# Patient Record
Sex: Male | Born: 1985 | Race: White | Hispanic: No | Marital: Single | Smoking: Never smoker
Health system: Southern US, Community
[De-identification: ages and names within clinical notes are randomized; demographics above are authoritative.]

## PROBLEM LIST (undated history)

## (undated) DIAGNOSIS — N200 Calculus of kidney: Secondary | ICD-10-CM

## (undated) DIAGNOSIS — J45909 Unspecified asthma, uncomplicated: Secondary | ICD-10-CM

## (undated) HISTORY — PX: HERNIA REPAIR: SHX51

## (undated) HISTORY — DX: Unspecified asthma, uncomplicated: J45.909

## (undated) HISTORY — DX: Calculus of kidney: N20.0

---

## 2013-03-19 ENCOUNTER — Ambulatory Visit: Payer: Self-pay | Admitting: Surgery

## 2013-03-19 LAB — CBC WITH DIFFERENTIAL/PLATELET
Basophil #: 0 10*3/uL (ref 0.0–0.1)
Basophil %: 0.6 %
Eosinophil #: 0.1 10*3/uL (ref 0.0–0.7)
Eosinophil %: 2 %
HCT: 47 % (ref 40.0–52.0)
HGB: 16.2 g/dL (ref 13.0–18.0)
Lymphocyte %: 31.6 %
MCHC: 34.3 g/dL (ref 32.0–36.0)
Monocyte #: 0.6 x10 3/mm (ref 0.2–1.0)
Monocyte %: 11.8 %
Platelet: 171 10*3/uL (ref 150–440)

## 2013-03-19 LAB — BASIC METABOLIC PANEL
Anion Gap: 1 — ABNORMAL LOW (ref 7–16)
Chloride: 107 mmol/L (ref 98–107)
Creatinine: 0.92 mg/dL (ref 0.60–1.30)
EGFR (African American): >60
EGFR (Non-African Amer.): >60
Osmolality: 276 (ref 275–301)
Potassium: 4.3 mmol/L (ref 3.5–5.1)

## 2013-03-25 ENCOUNTER — Ambulatory Visit: Payer: Self-pay | Admitting: Surgery

## 2014-10-24 NOTE — Op Note (Signed)
PATIENT NAME:  Gordon Chang, Gordon Chang MR#:  [id number] DATE OF BIRTH:  1985-09-01  DATE OF PROCEDURE:  03/25/2013  PREOPERATIVE DIAGNOSIS: Right inguinal hernia.   POSTOPERATIVE DIAGNOSIS: Large indirect right inguinal hernia.   PROCEDURE PERFORMED: Laparoscopic right inguinal hernia repair, total TEP repair.   ESTIMATED BLOOD LOSS: 10 mL.   COMPLICATIONS: None.   SPECIMEN: None.   ANESTHESIA: General.  INDICATION FOR SURGERY: Mr. Gordon Chang is a pleasant 29 year old male, who presented to my office with a large symptomatic right inguinal hernia. We thus brought him to the operating room suite for right inguinal hernia repair with mesh.   DETAILS OF PROCEDURE: As follows: Informed consent was obtained. Mr. Gordon Chang was brought to the operating room suite. He was laid supine on the operating room table. He was induced. Endotracheal tube was placed and general anesthesia was administered. His abdomen and right groin were then prepped and draped in standard surgical fashion. A timeout was then performed correctly identifying patient name, operative site and procedure to be performed. An infraumbilical incision was made on the right side. His rectus sheath was cleaned off. It was incised with an 11 blade knife. The rectus was retracted laterally and an S retractor was used to obtain the retrorectus space anterior to the posterior sheath. The balloon trocar was then placed underneath the right rectus muscle into the preperitoneal space. The balloon was insufflated and then removed. A trocar was then placed into the same space and inflated. A 30 degree 10 mm scope was then placed and the preperitoneal space was insufflated. There was a large indirect hernia. Cooper's was cleared off. The hernia sac was reduced off the colon and the preperitoneal space was dissected further laterally. A 4 x 6 mm piece of Atrium mesh was then placed into the preperitoneal space. It was cut lengthwise to make a ring around the cord. The  fascia was then flattened out. It was secured to the anterior abdominal wall as well as to Cooper's ligament and laterally. I then placed the cord through this piece of mesh and then re-created the external ring. The external ring was re-created with tacks. After I was happy with mesh placement, the preperitoneal space was desufflated with insurance that the mesh did not roll up on itself. The anterior rectus sheath fasciotomy was then closed with a figure-of-eight 0 Vicryl. All ports were then closed with interrupted 4-0 Monocryl deep dermal sutures. Dermabond was then placed over the wounds. The patient was then awoken, extubated and brought to the postanesthesia care unit. There were no immediate complications. Needle, sponge, and instrument counts were correct at the end of the procedure.   ____________________________ Si Raiderhristopher A. Chanise Habeck, MD cal:aw D: 03/25/2013 09:49:31 ET T: 03/25/2013 10:07:08 ET JOB#: [id number]  cc: Cristal Deerhristopher A. Ineta Sinning, MD, <Dictator> Jarvis NewcomerHRISTOPHER A Ousman Dise MD ELECTRONICALLY SIGNED 04/01/2013 11:31

## 2015-11-26 ENCOUNTER — Ambulatory Visit
Admission: RE | Admit: 2015-11-26 | Discharge: 2015-11-26 | Disposition: A | Discharge status: Home / Self Care | Payer: 59 | Source: Ambulatory Visit | Attending: Emergency Medicine | Admitting: Emergency Medicine

## 2015-11-26 ENCOUNTER — Other Ambulatory Visit (HOSPITAL_COMMUNITY): Payer: Self-pay | Admitting: Emergency Medicine

## 2015-11-26 ENCOUNTER — Other Ambulatory Visit: Payer: Self-pay | Admitting: Emergency Medicine

## 2015-11-26 DIAGNOSIS — R938 Abnormal findings on diagnostic imaging of other specified body structures: Secondary | ICD-10-CM | POA: Insufficient documentation

## 2015-11-26 DIAGNOSIS — N50811 Right testicular pain: Secondary | ICD-10-CM

## 2016-08-25 ENCOUNTER — Encounter: Payer: Self-pay | Admitting: Emergency Medicine

## 2016-08-25 ENCOUNTER — Emergency Department
Admission: EM | Admit: 2016-08-25 | Discharge: 2016-08-25 | Disposition: A | Discharge status: Home / Self Care | Payer: 59 | Attending: Emergency Medicine | Admitting: Emergency Medicine

## 2016-08-25 ENCOUNTER — Emergency Department: Payer: 59

## 2016-08-25 DIAGNOSIS — N23 Unspecified renal colic: Secondary | ICD-10-CM

## 2016-08-25 DIAGNOSIS — R109 Unspecified abdominal pain: Secondary | ICD-10-CM | POA: Diagnosis present

## 2016-08-25 LAB — BASIC METABOLIC PANEL
Anion gap: 10 (ref 5–15)
BUN: 19 mg/dL (ref 6–20)
CALCIUM: 9.6 mg/dL (ref 8.9–10.3)
CHLORIDE: 104 mmol/L (ref 101–111)
CO2: 25 mmol/L (ref 22–32)
CREATININE: 1.14 mg/dL (ref 0.61–1.24)
GFR calc non Af Amer: >60 mL/min (ref >60)
Glucose, Bld: 120 mg/dL — ABNORMAL HIGH (ref 65–99)
Potassium: 3.9 mmol/L (ref 3.5–5.1)
SODIUM: 139 mmol/L (ref 135–145)

## 2016-08-25 LAB — URINALYSIS, COMPLETE (UACMP) WITH MICROSCOPIC
BACTERIA UA: NONE SEEN
Bilirubin Urine: NEGATIVE
GLUCOSE, UA: NEGATIVE mg/dL
KETONES UR: 80 mg/dL — AB
Leukocytes, UA: NEGATIVE
Nitrite: NEGATIVE
PROTEIN: 30 mg/dL — AB
Specific Gravity, Urine: 1.029 (ref 1.005–1.030)
pH: 6 (ref 5.0–8.0)

## 2016-08-25 LAB — CBC
HCT: 46.3 % (ref 40.0–52.0)
HEMOGLOBIN: 15.9 g/dL (ref 13.0–18.0)
MCH: 29.4 pg (ref 26.0–34.0)
MCHC: 34.3 g/dL (ref 32.0–36.0)
MCV: 85.6 fL (ref 80.0–100.0)
Platelets: 217 10*3/uL (ref 150–440)
RBC: 5.41 MIL/uL (ref 4.40–5.90)
RDW: 12.6 % (ref 11.5–14.5)
WBC: 10.5 10*3/uL (ref 3.8–10.6)

## 2016-08-25 MED ORDER — DOCUSATE SODIUM 100 MG PO CAPS: ORAL_CAPSULE | ORAL | 0 refills | Status: DC

## 2016-08-25 MED ORDER — TAMSULOSIN HCL 0.4 MG PO CAPS: ORAL_CAPSULE | ORAL | 0 refills | Status: DC

## 2016-08-25 MED ORDER — HYDROCODONE-ACETAMINOPHEN 5-325 MG PO TABS: 1.0000 | ORAL_TABLET | ORAL | 0 refills | Status: DC | PRN

## 2016-08-25 MED ORDER — ONDANSETRON 4 MG PO TBDP: ORAL_TABLET | ORAL | 0 refills | Status: DC

## 2016-08-25 MED ORDER — HYDROCODONE-ACETAMINOPHEN 5-325 MG PO TABS
2.0000 | ORAL_TABLET | Freq: Once | ORAL | Status: AC
  Administered 2016-08-25: 1 via ORAL
  Filled 2016-08-25: qty 2

## 2016-08-25 NOTE — ED Notes (Signed)
MD at the bedside for pt evaluation  

## 2016-08-25 NOTE — ED Triage Notes (Addendum)
Patient ambulatory to triage with steady gait, without difficulty or distress noted; pt reports urinary frequency and right flank pain; denies hx of same; denies any other accomp symptoms; denies pain to abd but st some "pressure" to suprapubic region with urination

## 2016-08-25 NOTE — ED Provider Notes (Signed)
[facility] Emergency Department Provider Note  ____________________________________________   First MD Initiated Contact with Patient 08/25/16 [phone]     (approximate)  I have reviewed the triage vital signs and the nursing notes.   HISTORY  Chief Complaint Flank Pain    HPI Gordon Chang is a 31 y.o. male with no significant PMH who presents for evaluation of acute onset right flank pain radiating to the abdomen.  Started yesterday, went away, then came back tonight.  One episode of emesis and some persistent nausea.  Increased urinary  frequency, no dysuria, no hematuria.  Movement/ambulation makes the pain worse, rest and supine position makes it better.  Denies fever/chills, CP, SOB.   History reviewed. No pertinent past medical history.  There are no active problems to display for this patient.   Past Surgical History:  Procedure Laterality Date  . HERNIA REPAIR      Prior to Admission medications   Medication Sig Start Date End Date Taking? Authorizing Provider  docusate sodium (COLACE) 100 MG capsule Take 1 tablet once or twice daily as needed for constipation while taking narcotic pain medicine 08/25/16   Loleta Roseory Pragya Lofaso, MD  HYDROcodone-acetaminophen (NORCO/VICODIN) 5-325 MG tablet Take 1-2 tablets by mouth every 4 (four) hours as needed for moderate pain. 08/25/16   Loleta Roseory Mary-Ann Pennella, MD  ondansetron (ZOFRAN ODT) 4 MG disintegrating tablet Allow 1-2 tablets to dissolve in your mouth every 8 hours as needed for nausea/vomiting 08/25/16   Loleta Roseory Armstrong Creasy, MD  tamsulosin [facility](FLOMAX) 0.4 MG CAPS capsule Take 1 tablet by mouth daily until you pass the kidney stone or no longer have symptoms 08/25/16   Loleta Roseory Agam Tuohy, MD    Allergies Patient has no known allergies.  No family history on file.  Social History Social History  Substance Use Topics  . Smoking status: Never Smoker  . Smokeless tobacco: Never Used  . Alcohol use No    Review of  Systems Constitutional: No fever/chills Eyes: No visual changes. ENT: No sore throat. Cardiovascular: Denies chest pain. Respiratory: Denies shortness of breath. Gastrointestinal: Right flank pain radiating to right side of abdomen.  Emesis x 1, no diarrhea. Genitourinary: Negative for dysuria.  Increased urinary frequency. Musculoskeletal: Negative for back pain. Skin: Negative for rash. Neurological: Negative for headaches, focal weakness or numbness.  10-point ROS otherwise negative.  ____________________________________________   PHYSICAL EXAM:  VITAL SIGNS: ED Triage Vitals  Enc Vitals Group     BP 08/25/16 0216 (!) 161/90     Pulse Rate 08/25/16 0216 64     Resp 08/25/16 0216 18     Temp 08/25/16 0216 97.7 F (36.5 C)     Temp Source 08/25/16 0216 Oral     SpO2 08/25/16 0216 100 %     Weight 08/25/16 0215 125 lb (56.7 kg)     Height 08/25/16 0215 5\' 7"  (1.702 m)     Head Circumference --      Peak Flow --      Pain Score 08/25/16 0214 8     Pain Loc --      Pain Edu? --      Excl. in GC? --     Constitutional: Alert and oriented. Well appearing and in no acute distress. Eyes: Conjunctivae are normal. PERRL. EOMI. Head: Atraumatic. Nose: No congestion/rhinnorhea. Mouth/Throat: Mucous membranes are moist. Neck: No stridor.  No meningeal signs.   Cardiovascular: Normal rate, regular rhythm. Good peripheral circulation. Grossly normal heart sounds. Respiratory: Normal respiratory effort.  No retractions. Lungs CTAB. Gastrointestinal: Soft and nontender. No distention.  Mild right-sided CVA tenderness. Musculoskeletal: No lower extremity tenderness nor edema. No gross deformities of extremities. Neurologic:  Normal speech and language. No gross focal neurologic deficits are appreciated.  Skin:  Skin is warm, dry and intact. No rash noted. Psychiatric: Mood and affect are normal. Speech and behavior are normal.  ____________________________________________    LABS (all labs ordered are listed, but only abnormal results are displayed)  Labs Reviewed  URINALYSIS, COMPLETE (UACMP) WITH MICROSCOPIC - Abnormal; Notable for the following:       Result Value   Color, Urine YELLOW (*)    APPearance CLEAR (*)    Hgb urine dipstick LARGE (*)    Ketones, ur 80 (*)    Protein, ur 30 (*)    Squamous Epithelial / LPF 0-5 (*)    All other components within normal limits  BASIC METABOLIC PANEL - Abnormal; Notable for the following:    Glucose, Bld 120 (*)    All other components within normal limits  CBC   ____________________________________________  EKG  None - EKG not ordered by ED physician ____________________________________________  RADIOLOGY   Ct Renal Stone Study  Result Date: 08/25/2016 CLINICAL DATA:  Urinary frequency and right flank pain EXAM: CT ABDOMEN AND PELVIS WITHOUT CONTRAST TECHNIQUE: Multidetector CT imaging of the abdomen and pelvis was performed following the standard protocol without IV contrast. COMPARISON:  None. FINDINGS: Lower chest: No acute abnormality. Hepatobiliary: Calcified gallstone. No focal hepatic abnormality. No biliary dilatation. Pancreas: Unremarkable. No pancreatic ductal dilatation or surrounding inflammatory changes. Spleen: Normal in size without focal abnormality. Adrenals/Urinary Tract: Adrenal glands within normal limits. Multiple nonobstructing stones in the left kidney, the largest is seen in the midpole and measures 4 mm in size. Punctate stone in the upper pole right kidney. Mild right hydronephrosis and hydroureter, secondary to a 3 mm stone in the mid right ureter. The bladder is unremarkable. Stomach/Bowel: Stomach is within normal limits. Appendix appears normal. No evidence of bowel wall thickening, distention, or inflammatory changes. Vascular/Lymphatic: No significant vascular findings are present. No enlarged abdominal or pelvic lymph nodes. Reproductive: Prostate is unremarkable. Other:  Evidence of prior lower abdominal and right inguinal hernial repair. No free air or free fluid. Musculoskeletal: No acute or significant osseous findings. IMPRESSION: Mild right hydronephrosis and hydroureter secondary to a 3 mm stone in the mid right ureter. There are multiple intrarenal calculi bilaterally. Calcified gallstone. Electronically Signed   By: Jasmine Pang M.D.   On: 08/25/2016 03:36    ____________________________________________   PROCEDURES  Procedure(s) performed:   Procedures   Critical Care performed: No ____________________________________________   INITIAL IMPRESSION / ASSESSMENT AND PLAN / ED COURSE  Pertinent labs & imaging results that were available during my care of the patient were reviewed by me and considered in my medical decision making (see chart for details).  uncomplicated 3-mm right sided ureteral stone.  Sending urine culture given the presence of leukocytosis, but suspect that is due to the blood; no evidence of infection and no systemic symptoms.  Had my usual and customary discussion about kidney stones,   I gave my usual and customary return precautions.   Patient's pain is well controlled at this time.   Clinical Course as of Aug 26 403  Thu Aug 25, 2016  0357 I reviewed the patient's prescription history over the last 12 months in the [place] Controlled Substances Database, and the patient has filled no controlled substances  during that time.   [CF]    Clinical Course User Index [CF] Loleta Rose, MD    ____________________________________________  FINAL CLINICAL IMPRESSION(S) / ED DIAGNOSES  Final diagnoses:  Ureteral colic     MEDICATIONS GIVEN DURING THIS VISIT:  Medications  HYDROcodone-acetaminophen (NORCO/VICODIN) 5-325 MG per tablet 2 tablet (not administered)     NEW OUTPATIENT MEDICATIONS STARTED DURING THIS VISIT:  New Prescriptions   DOCUSATE SODIUM (COLACE) 100 MG CAPSULE    Take 1 tablet once or twice daily as  needed for constipation while taking narcotic pain medicine   HYDROCODONE-ACETAMINOPHEN (NORCO/VICODIN) 5-325 MG TABLET    Take 1-2 tablets by mouth every 4 (four) hours as needed for moderate pain.   ONDANSETRON (ZOFRAN ODT) 4 MG DISINTEGRATING TABLET    Allow 1-2 tablets to dissolve in your mouth every 8 hours as needed for nausea/vomiting   TAMSULOSIN (FLOMAX) 0.4 MG CAPS CAPSULE    Take 1 tablet by mouth daily until you pass the kidney stone or no longer have symptoms    Modified Medications   No medications on file    Discontinued Medications   No medications on file     Note:  This document was prepared using Dragon voice recognition software and may include unintentional dictation errors.    Loleta Rose, MD 08/25/16 [phone]

## 2016-08-25 NOTE — Discharge Instructions (Addendum)
You have been seen in the Emergency Department (ED) today for pain that we believe based on your workup, is caused by kidney stones.  As we have discussed, please drink plenty of fluids.  Please make a follow up appointment with the physician(s) listed elsewhere in this documentation. ° °You may take pain medication as needed but ONLY as prescribed.  Please also take your prescribed Flomax daily.  We also recommend that you take over-the-counter ibuprofen regularly according to label instructions over the next 5 days.  Take it with meals to minimize stomach discomfort. ° °Please see your doctor as soon as possible as stones may take 1-3 weeks to pass and you may require additional care or medications. ° °Do not drink alcohol, drive or participate in any other potentially dangerous activities while taking opiate pain medication as it may make you sleepy. Do not take this medication with any other sedating medications, either prescription or over-the-counter. If you were prescribed Percocet or Vicodin, do not take these with acetaminophen (Tylenol) as it is already contained within these medications. °  °Take Norco as needed for severe pain.  This medication is an opiate (or narcotic) pain medication and can be habit forming.  Use it as little as possible to achieve adequate pain control.  Do not use or use it with extreme caution if you have a history of opiate abuse or dependence.  If you are on a pain contract with your primary care doctor or a pain specialist, be sure to let them know you were prescribed this medication today from the [facility].  This medication is intended for your use only - do not give any to anyone else and keep it in a secure place where nobody else, especially children, have access to it.  It will also cause or worsen constipation, so you may want to consider taking an over-the-counter stool softener while you are taking this medication. ° °Return to the  Emergency Department (ED) or call your doctor if you have any worsening pain, fever, painful urination, are unable to urinate, or develop other symptoms that concern you. ° °

## 2017-01-15 NOTE — Progress Notes (Signed)
01/16/2017 8:59 AM   Gordon Chang 05/14/1986 [id number]  Referring provider: Jethro Bastosrawford, Anne W, NP [address] [place], [place] [address]  Chief Complaint  Patient presents with  . New Patient (Initial Visit)    testicular pain [facility] referral    HPI: Patient is a 31 year old Caucasian male who is referred by Jethro BastosAnne W Crawford, NP for right testicular pain and dysuria.  Reviewing referral records, patient first experienced right testicular pain in May 2017. A scrotal ultrasound was performed at that time which noted nonspecific predominantly hypoechoic collection along the inferior and lateral margins of the right testicle, most prominent inferiorly, which is nonspecific in appearance but is likely indicative of an acute infectious or inflammatory process. As the epididymal tail is not seen separate from this collection, I suspect this is sequela of an acute epididymitis. Given its nonspecific appearance, recommend follow-up scrotal ultrasound at some point to ensure resolution.  Both testicles appear normal. No evidence of testicular torsion or orchitis.  His urine culture, GC and chlamydia cultures and trichomonas were negative at that time. He was given NSAIDs and an antibiotic.  In February 2018, he was seen in the emergency department for right-sided flank pain and was found to have a small right UVJ stone.  CT Renal stone study noted mild right hydronephrosis and hydroureter secondary to a 3 mm stone in the mid right ureter. There are multiple intrarenal calculi bilaterally.  Calcified gallstone.  More recently, he was seen back at [facility] for right testicular pain and dysuria. Again his cultures were negative. He was given an antibiotic, Pyridium and NSAIDs. His symptoms are not resolved and he was referred to urology.  Today, he is experiencing frequency and dysuria.  He states the pain with urination started last Sunday associated with pain in the left  groin.  He also had an episode of left flank pain with bending.  He has not had gross hematuria.  He has not had fevers, chills, nausea or vomiting.  His UA today was unremarkable.  His PVR was 0 mL.  He states that his pain is at a level of 6/10 when it occurs.  It only lasts for a few minutes.    PMH: Past Medical History:  Diagnosis Date  . Asthma   . Kidney stone     Surgical History: Past Surgical History:  Procedure Laterality Date  . HERNIA REPAIR      Home Medications:  Allergies as of 01/16/2017   No Known Allergies     Medication List       Accurate as of 01/16/17  8:59 AM. Always use your most recent med list.          docusate sodium 100 MG capsule Commonly known as:  COLACE Take 1 tablet once or twice daily as needed for constipation while taking narcotic pain medicine   HYDROcodone-acetaminophen 5-325 MG tablet Commonly known as:  NORCO/VICODIN Take 1-2 tablets by mouth every 4 (four) hours as needed for moderate pain.   ondansetron 4 MG disintegrating tablet Commonly known as:  ZOFRAN ODT Allow 1-2 tablets to dissolve in your mouth every 8 hours as needed for nausea/vomiting   tamsulosin 0.4 MG Caps capsule Commonly known as:  FLOMAX Take 1 tablet by mouth daily until you pass the kidney stone or no longer have symptoms       Allergies: No Known Allergies  Family History: Family History  Problem Relation Age of Onset  .  Prostate cancer Neg Hx   . Kidney cancer Neg Hx   . Bladder Cancer Neg Hx     Social History:  reports that he has never smoked. He has never used smokeless tobacco. He reports that he drinks alcohol. He reports that he does not use drugs.  ROS: UROLOGY Frequent Urination?: Yes Hard to postpone urination?: No Burning/pain with urination?: Yes Get up at night to urinate?: No Leakage of urine?: No Urine stream starts and stops?: No Trouble starting stream?: No Do you have to strain to urinate?: No Blood in urine?:  No Urinary tract infection?: No Sexually transmitted disease?: No Injury to kidneys or bladder?: No Painful intercourse?: No Weak stream?: No Erection problems?: No Penile pain?: No  Gastrointestinal Nausea?: No Vomiting?: No Indigestion/heartburn?: No Diarrhea?: No Constipation?: No  Constitutional Fever: No Night sweats?: No Weight loss?: No Fatigue?: No  Skin Skin rash/lesions?: No Itching?: No  Eyes Blurred vision?: No Double vision?: No  Ears/Nose/Throat Sore throat?: No Sinus problems?: Yes  Hematologic/Lymphatic Swollen glands?: No Easy bruising?: No  Cardiovascular Leg swelling?: No Chest pain?: No  Respiratory Cough?: Yes Shortness of breath?: No  Endocrine Excessive thirst?: No  Musculoskeletal Back pain?: Yes Joint pain?: No  Neurological Headaches?: No Dizziness?: No  Psychologic Depression?: No Anxiety?: No  Physical Exam: BP (!) 161/92   Pulse 68   Ht 5\' 7"  (1.702 m)   Wt 121 lb 11.2 oz (55.2 kg)   BMI 19.06 kg/m   Constitutional: Well nourished. Alert and oriented, No acute distress. HEENT: [place] AT, moist mucus membranes. Trachea midline, no masses. Cardiovascular: No clubbing, cyanosis, or edema. Respiratory: Normal respiratory effort, no increased work of breathing. GI: Abdomen is soft, non tender, non distended, no abdominal masses. Liver and spleen not palpable.  No hernias appreciated.  Stool sample for occult testing is not indicated.   GU: No CVA tenderness.  No bladder fullness or masses.  Patient with circumcised phallus.   Urethral meatus is patent.  No penile discharge. No penile lesions or rashes. Scrotum without lesions, cysts, rashes and/or edema.  Testicles are located scrotally bilaterally. No masses are appreciated in the testicles. Left and right epididymis are normal. Rectal: Deferred.   Skin: No rashes, bruises or suspicious lesions. Lymph: No cervical or inguinal adenopathy. Neurologic: Grossly intact, no  focal deficits, moving all 4 extremities. Psychiatric: Normal mood and affect.  Laboratory Data: Lab Results  Component Value Date   WBC 10.5 08/25/2016   HGB 15.9 08/25/2016   HCT 46.3 08/25/2016   MCV 85.6 08/25/2016   PLT 217 08/25/2016    Lab Results  Component Value Date   CREATININE 1.14 08/25/2016   I have independently reviewed the labs.  Urinalysis Unremarkable.  See EPIC.    Pertinent Imaging: CLINICAL DATA:  Right testicular pain hand inflammation for 2 days.  Right inguinal hernia repair in 2014.  EXAM: SCROTAL ULTRASOUND  DOPPLER ULTRASOUND OF THE TESTICLES  TECHNIQUE: Complete ultrasound examination of the testicles, epididymis, and other scrotal structures was performed. Color and spectral Doppler ultrasound were also utilized to evaluate blood flow to the testicles.  COMPARISON:  None.  FINDINGS: Right testicle  Measurements: 3.9 x 2.2 x 2.9. No mass or microlithiasis visualized. There is, however, a heterogeneous predominantly hypoechoic collection within the soft tissues lateral and inferior to the right testicle. This likely represents an infectious or inflammatory collection but is of uncertain etiology. It is in the expected area of the epididymal tail and, therefore, could represent  sequela of an acute epididymitis. Sonographer does not describe any peristalsing bowel in this area to suggest bowel hernia.  Left testicle  Measurements: 3.9 x 2.3 x 2.9 cm. No mass or microlithiasis visualized.  Right epididymis:  Normal in size and appearance.  Left epididymis:  Normal in size and appearance.  Hydrocele:  Small hydroceles bilaterally.  Varicocele:  None visualized.  Pulsed Doppler interrogation of both testes demonstrates normal low resistance arterial and venous waveforms bilaterally.  IMPRESSION: 1. Nonspecific predominantly hypoechoic collection along the inferior and lateral margins of the right testicle,  most prominent inferiorly, which is nonspecific in appearance but is likely indicative of an acute infectious or inflammatory process. As the epididymal tail is not seen separate from this collection, I suspect this is sequela of an acute epididymitis. Given its nonspecific appearance, recommend follow-up scrotal ultrasound at some point to ensure resolution. 2. Both testicles appear normal. No evidence of testicular torsion or orchitis.   Electronically Signed   By: Bary Richard M.D.   On: 11/26/2015 19:44  CLINICAL DATA:  Urinary frequency and right flank pain  EXAM: CT ABDOMEN AND PELVIS WITHOUT CONTRAST  TECHNIQUE: Multidetector CT imaging of the abdomen and pelvis was performed following the standard protocol without IV contrast.  COMPARISON:  None.  FINDINGS: Lower chest: No acute abnormality.  Hepatobiliary: Calcified gallstone. No focal hepatic abnormality. No biliary dilatation.  Pancreas: Unremarkable. No pancreatic ductal dilatation or surrounding inflammatory changes.  Spleen: Normal in size without focal abnormality.  Adrenals/Urinary Tract: Adrenal glands within normal limits. Multiple nonobstructing stones in the left kidney, the largest is seen in the midpole and measures 4 mm in size. Punctate stone in the upper pole right kidney. Mild right hydronephrosis and hydroureter, secondary to a 3 mm stone in the mid right ureter. The bladder is unremarkable.  Stomach/Bowel: Stomach is within normal limits. Appendix appears normal. No evidence of bowel wall thickening, distention, or inflammatory changes.  Vascular/Lymphatic: No significant vascular findings are present. No enlarged abdominal or pelvic lymph nodes.  Reproductive: Prostate is unremarkable.  Other: Evidence of prior lower abdominal and right inguinal hernial repair. No free air or free fluid.  Musculoskeletal: No acute or significant osseous  findings.  IMPRESSION: Mild right hydronephrosis and hydroureter secondary to a 3 mm stone in the mid right ureter. There are multiple intrarenal calculi bilaterally.  Calcified gallstone.   Electronically Signed   By: Jasmine Pang M.D.   On: 08/25/2016 03:36 I have independently reviewed the films.    Assessment & Plan:   1. Left flank pain  - history of stones  - will obtain CT Renal stone study for further evaluation  - Advised to contact our office or seek treatment in the ED if becomes febrile or pain/ vomiting are difficult control in order to arrange for emergent/urgent intervention  2. Left groin pain  - may be due to an ureteral stone   - CT Renal stone study pending  3. History of nephrolithiasis  - CT Renal stone study pending  4. Right epididymal mass/inflammation  - repeat scrotal ultrasound to ensure resolution   Return for for scrotal ultrasound report and CT report.  These notes generated with voice recognition software. I apologize for typographical errors.  Michiel Cowboy, PA-C  [facility] [address], [address] [place], [place] [address] [phone]

## 2017-01-16 ENCOUNTER — Encounter: Payer: Self-pay | Admitting: Urology

## 2017-01-16 ENCOUNTER — Ambulatory Visit (INDEPENDENT_AMBULATORY_CARE_PROVIDER_SITE_OTHER): Payer: 59 | Admitting: Urology

## 2017-01-16 ENCOUNTER — Telehealth: Payer: Self-pay | Admitting: Urology

## 2017-01-16 VITALS — BP 161/92 | HR 68 | Ht 67.0 in | Wt 121.7 lb

## 2017-01-16 DIAGNOSIS — N50819 Testicular pain, unspecified: Secondary | ICD-10-CM | POA: Diagnosis not present

## 2017-01-16 DIAGNOSIS — R109 Unspecified abdominal pain: Secondary | ICD-10-CM | POA: Diagnosis not present

## 2017-01-16 DIAGNOSIS — Z87442 Personal history of urinary calculi: Secondary | ICD-10-CM | POA: Diagnosis not present

## 2017-01-16 DIAGNOSIS — N509 Disorder of male genital organs, unspecified: Secondary | ICD-10-CM | POA: Diagnosis not present

## 2017-01-16 DIAGNOSIS — N5089 Other specified disorders of the male genital organs: Secondary | ICD-10-CM

## 2017-01-16 LAB — URINALYSIS, COMPLETE
Bilirubin, UA: NEGATIVE
Glucose, UA: NEGATIVE
Ketones, UA: NEGATIVE
Leukocytes, UA: NEGATIVE
NITRITE UA: NEGATIVE
PH UA: 6 (ref 5.0–7.5)
Protein, UA: NEGATIVE
RBC, UA: NEGATIVE
Specific Gravity, UA: >1.03 — ABNORMAL HIGH (ref 1.005–1.030)
Urobilinogen, Ur: 2 mg/dL — ABNORMAL HIGH (ref 0.2–1.0)

## 2017-01-16 LAB — MICROSCOPIC EXAMINATION
Epithelial Cells (non renal): NONE SEEN /hpf (ref 0–10)
RBC MICROSCOPIC, UA: NONE SEEN /HPF (ref 0–?)

## 2017-01-16 LAB — BLADDER SCAN AMB NON-IMAGING: SCAN RESULT: 0

## 2017-01-16 NOTE — Telephone Encounter (Signed)
Please send my note to Remi HaggardAnne Crawford, NP at [facility].

## 2017-01-17 NOTE — Telephone Encounter (Signed)
done

## 2017-01-19 LAB — GC/CHLAMYDIA PROBE AMP
Chlamydia trachomatis, NAA: NEGATIVE
Neisseria gonorrhoeae by PCR: NEGATIVE

## 2017-01-27 ENCOUNTER — Ambulatory Visit
Admission: RE | Admit: 2017-01-27 | Discharge: 2017-01-27 | Disposition: A | Discharge status: Home / Self Care | Payer: 59 | Source: Ambulatory Visit | Attending: Urology | Admitting: Urology

## 2017-01-27 ENCOUNTER — Other Ambulatory Visit: Payer: 59

## 2017-01-27 DIAGNOSIS — R109 Unspecified abdominal pain: Secondary | ICD-10-CM

## 2017-01-27 DIAGNOSIS — N509 Disorder of male genital organs, unspecified: Secondary | ICD-10-CM | POA: Insufficient documentation

## 2017-01-27 DIAGNOSIS — N200 Calculus of kidney: Secondary | ICD-10-CM | POA: Insufficient documentation

## 2017-01-27 DIAGNOSIS — Z87442 Personal history of urinary calculi: Secondary | ICD-10-CM | POA: Diagnosis present

## 2017-01-27 DIAGNOSIS — N50819 Testicular pain, unspecified: Secondary | ICD-10-CM | POA: Diagnosis not present

## 2017-01-27 DIAGNOSIS — K8020 Calculus of gallbladder without cholecystitis without obstruction: Secondary | ICD-10-CM | POA: Insufficient documentation

## 2017-01-27 DIAGNOSIS — N5089 Other specified disorders of the male genital organs: Secondary | ICD-10-CM

## 2017-02-01 NOTE — Progress Notes (Signed)
02/02/2017 4:32 PM   Glenna DurandBrian Bobeck 1986/02/20 [id number]  Referring provider: No referring provider defined for this encounter.  Chief Complaint  Patient presents with  . Results    CT / US    HPI: 31 yo WM who presents today to discuss his scrotal ultrasound report and CT report.  Background history Patient is a 31 year old Caucasian male who is referred by Jethro BastosAnne W Crawford, NP for right testicular pain and dysuria.  Reviewing referral records, patient first experienced right testicular pain in May 2017. A scrotal ultrasound was performed at that time which noted nonspecific predominantly hypoechoic collection along the inferior and lateral margins of the right testicle, most prominent inferiorly, which is nonspecific in appearance but is likely indicative of an acute infectious or inflammatory process. As the epididymal tail is not seen separate from this collection, I suspect this is sequela of an acute epididymitis. Given its nonspecific appearance, recommend follow-up scrotal ultrasound at some point to ensure resolution.  Both testicles appear normal. No evidence of testicular torsion or orchitis.  His urine culture, GC and chlamydia cultures and trichomonas were negative at that time. He was given NSAIDs and an antibiotic.  In February 2018, he was seen in the emergency department for right-sided flank pain and was found to have a small right UVJ stone.  CT Renal stone study noted mild right hydronephrosis and hydroureter secondary to a 3 mm stone in the mid right ureter. There are multiple intrarenal calculi bilaterally.  Calcified gallstone.  More recently, he was seen back at [facility] for right testicular pain and dysuria. Again his cultures were negative. He was given an antibiotic, Pyridium and NSAIDs. His symptoms are not resolved and he was referred to urology.    At his initial visit, he was experiencing frequency and dysuria.  He stated the pain with urination started  last Sunday associated with pain in the left groin.  He also had an episode of left flank pain with bending.  He has not had gross hematuria.  He has not had fevers, chills, nausea or vomiting.  His UA was unremarkable.  His PVR was 0 mL.  He states that his pain is at a level of 6/10 when it occurs.  It only lasts for a few minutes.    Scrotal ultrasound performed on 01/27/2017 noted no explanation for testicular pain.  Lack of visualization of the previously described area of right epididymal hypoechogenicity. This was likely indicative of epididymitis on the prior.  CT Renal stone study performed on 01/27/2017 noted tiny nonobstructive right renal calculus. No evidence of ureteral calculi, hydronephrosis, or other acute findings.  Cholelithiasis.  No radiographic evidence of cholecystitis.  Today, he is asymptomatic.   He states he passed a stone a few days prior to his CT scan.  He denies dysuria, gross hematuria and suprapubic pain. He also denies any further flank pain, fever, chills, nausea and vomiting.   PMH: Past Medical History:  Diagnosis Date  . Asthma   . Kidney stone     Surgical History: Past Surgical History:  Procedure Laterality Date  . HERNIA REPAIR      Home Medications:  Allergies as of 02/02/2017   No Known Allergies     Medication List       Accurate as of 02/02/17  4:32 PM. Always use your most recent med list.          docusate sodium 100 MG capsule Commonly known as:  COLACE Take 1 tablet once or twice daily as needed for constipation while taking narcotic pain medicine   HYDROcodone-acetaminophen 5-325 MG tablet Commonly known as:  NORCO/VICODIN Take 1-2 tablets by mouth every 4 (four) hours as needed for moderate pain.   ondansetron 4 MG disintegrating tablet Commonly known as:  ZOFRAN ODT Allow 1-2 tablets to dissolve in your mouth every 8 hours as needed for nausea/vomiting   tamsulosin 0.4 MG Caps capsule Commonly known as:  FLOMAX Take 1  tablet by mouth daily until you pass the kidney stone or no longer have symptoms       Allergies: No Known Allergies  Family History: Family History  Problem Relation Age of Onset  . Prostate cancer Neg Hx   . Kidney cancer Neg Hx   . Bladder Cancer Neg Hx     Social History:  reports that he has never smoked. He has never used smokeless tobacco. He reports that he drinks alcohol. He reports that he does not use drugs.  ROS: UROLOGY Frequent Urination?: No Hard to postpone urination?: No Burning/pain with urination?: No Get up at night to urinate?: No Leakage of urine?: No Urine stream starts and stops?: No Trouble starting stream?: No Do you have to strain to urinate?: No Blood in urine?: No Urinary tract infection?: No Sexually transmitted disease?: No Injury to kidneys or bladder?: No Painful intercourse?: No Weak stream?: No Erection problems?: No Penile pain?: No  Gastrointestinal Nausea?: No Vomiting?: No Indigestion/heartburn?: No Diarrhea?: No Constipation?: No  Constitutional Fever: No Night sweats?: No Weight loss?: No Fatigue?: No  Skin Skin rash/lesions?: No Itching?: No  Eyes Blurred vision?: No Double vision?: No  Ears/Nose/Throat Sore throat?: No Sinus problems?: No  Hematologic/Lymphatic Swollen glands?: No Easy bruising?: No  Cardiovascular Leg swelling?: No Chest pain?: No  Respiratory Cough?: No Shortness of breath?: No  Endocrine Excessive thirst?: No  Musculoskeletal Back pain?: No Joint pain?: No  Neurological Headaches?: No Dizziness?: No  Psychologic Depression?: No Anxiety?: No  Physical Exam: BP (!) 159/91   Pulse 65   Ht 5\' 7"  (1.702 m)   Wt 118 lb 4.8 oz (53.7 kg)   BMI 18.53 kg/m   Constitutional: Well nourished. Alert and oriented, No acute distress. HEENT: [place] AT, moist mucus membranes. Trachea midline, no masses. Cardiovascular: No clubbing, cyanosis, or edema. Respiratory: Normal  respiratory effort, no increased work of breathing. Skin: No rashes, bruises or suspicious lesions. Lymph: No cervical or inguinal adenopathy. Neurologic: Grossly intact, no focal deficits, moving all 4 extremities. Psychiatric: Normal mood and affect.  Laboratory Data: Lab Results  Component Value Date   WBC 10.5 08/25/2016   HGB 15.9 08/25/2016   HCT 46.3 08/25/2016   MCV 85.6 08/25/2016   PLT 217 08/25/2016    Lab Results  Component Value Date   CREATININE 1.14 08/25/2016   I have independently reviewed the labs.   Pertinent Imaging: CLINICAL DATA:  Testicular pain and epididymal mass on prior ultrasound.  EXAM: SCROTAL ULTRASOUND  DOPPLER ULTRASOUND OF THE TESTICLES  TECHNIQUE: Complete ultrasound examination of the testicles, epididymis, and other scrotal structures was performed. Color and spectral Doppler ultrasound were also utilized to evaluate blood flow to the testicles.  COMPARISON:  Ultrasound of 11/26/2015.  FINDINGS: Right testicle  Measurements: 4.0 x 2.6 x 2.2 cm. No mass or microlithiasis visualized.  Left testicle  Measurements: 3.8 x 2.6 x 2.2 cm. No mass or microlithiasis visualized.  Right epididymis: Normal in size and appearance. The previously  described right epididymal area of hypoechogenicity is not identified.  Left epididymis:  Normal in size and appearance.  Hydrocele:  None visualized.  Varicocele:  None visualized.  Pulsed Doppler interrogation of both testes demonstrates normal low resistance arterial and venous waveforms bilaterally.  IMPRESSION: 1. No explanation for testicular pain. 2. Lack of visualization of the previously described area of right epididymal hypoechogenicity. This was likely indicative of epididymitis on the prior.   Electronically Signed   By: Jeronimo GreavesKyle  Talbot M.D.   On: 01/27/2017 16:58  CLINICAL DATA:  Left flank pain radiating to left groin for 2  weeks. Nephrolithiasis.  EXAM: CT ABDOMEN AND PELVIS WITHOUT CONTRAST  TECHNIQUE: Multidetector CT imaging of the abdomen and pelvis was performed following the standard protocol without IV contrast.  COMPARISON:  08/25/2016  FINDINGS: Lower chest: No acute findings.  Hepatobiliary: No masses visualized on this unenhanced exam. Small calcified gallstone again seen, without evidence of cholecystitis.  Pancreas: No mass or inflammatory process visualized on this unenhanced exam.  Spleen:  Within normal limits in size.  Adrenals/Urinary tract: A 2 mm nonobstructive calculus is seen in the upper pole of the right kidney. No evidence of ureteral calculi or hydronephrosis involving either kidney. Empty urinary bladder.  Stomach/Bowel: No evidence of obstruction, inflammatory process, or abnormal fluid collections. Normal appendix visualized.  Vascular/Lymphatic: No pathologically enlarged lymph nodes identified. No evidence of abdominal aortic aneurysm.  Reproductive:  No mass or other significant abnormality.  Other: Surgical mesh seen in the right inguinal region, without evidence of recurrent hernia.  Musculoskeletal:  No suspicious bone lesions identified.  IMPRESSION: Tiny nonobstructive right renal calculus. No evidence of ureteral calculi, hydronephrosis, or other acute findings.  Cholelithiasis.  No radiographic evidence of cholecystitis.   Electronically Signed   By: Myles RosenthalJohn  Stahl M.D.   On: 01/27/2017 16:57  I have independently reviewed the films.    Assessment & Plan:   1. Left flank pain  - resolved after passage of stone   2. Left groin pain  - resolved after the passage of stone    3. Right renal calculus  - patient would like to undergo a 24 hour metabolic workup - RTC for report  - KUB in one year  - Advised to contact our office or seek treatment in the ED if becomes febrile or pain/ vomiting are difficult control in order  to arrange for emergent/urgent intervention  4. Right epididymal mass/inflammation  - resolved   Return for Litholink report.  These notes generated with voice recognition software. I apologize for typographical errors.  Michiel CowboySHANNON Jahvon Gosline, PA-C  [facility] [address], [address] [place], [place] [address] [phone]

## 2017-02-02 ENCOUNTER — Encounter: Payer: Self-pay | Admitting: Urology

## 2017-02-02 ENCOUNTER — Telehealth: Payer: Self-pay | Admitting: Urology

## 2017-02-02 ENCOUNTER — Ambulatory Visit (INDEPENDENT_AMBULATORY_CARE_PROVIDER_SITE_OTHER): Payer: 59 | Admitting: Urology

## 2017-02-02 VITALS — BP 159/91 | HR 65 | Ht 67.0 in | Wt 118.3 lb

## 2017-02-02 DIAGNOSIS — N200 Calculus of kidney: Secondary | ICD-10-CM

## 2017-02-02 DIAGNOSIS — R109 Unspecified abdominal pain: Secondary | ICD-10-CM | POA: Diagnosis not present

## 2017-02-02 DIAGNOSIS — N509 Disorder of male genital organs, unspecified: Secondary | ICD-10-CM | POA: Diagnosis not present

## 2017-02-02 DIAGNOSIS — N50819 Testicular pain, unspecified: Secondary | ICD-10-CM | POA: Diagnosis not present

## 2017-02-02 DIAGNOSIS — N5089 Other specified disorders of the male genital organs: Secondary | ICD-10-CM

## 2017-02-02 NOTE — Telephone Encounter (Signed)
Would you order a Litholink on this patient? 

## 2017-02-06 NOTE — Telephone Encounter (Signed)
Done

## 2017-02-23 ENCOUNTER — Other Ambulatory Visit: Payer: 59

## 2017-02-28 ENCOUNTER — Other Ambulatory Visit: Payer: Self-pay | Admitting: Urology

## 2017-03-07 ENCOUNTER — Telehealth: Payer: Self-pay

## 2017-03-07 NOTE — Telephone Encounter (Signed)
Patient returned call. He has a follow up appointment on 03/20/17.

## 2017-03-07 NOTE — Telephone Encounter (Signed)
LMOM

## 2017-03-07 NOTE — Telephone Encounter (Signed)
-----   Message from Shannon AHarle Battiest McGowan, PA-C sent at 02/28/2017  4:58 PM EDT ----- Please let Mr. Donnie Ahoilley know that his Litholink report is in and he can now make an appointment to go over the results.

## 2017-03-09 NOTE — Progress Notes (Signed)
Please let Mr. Gordon Chang know that his Litholink report is available and he can make an appointment to go over the results.

## 2017-03-19 NOTE — Progress Notes (Signed)
03/20/2017 4:37 PM   Gordon Chang 04-11-1986 [id number]  Referring provider: No referring provider defined for this encounter.  Chief Complaint  Patient presents with  . Results    HPI: 31 yo WM who presents today to discuss his Litholink results.    Background history Patient is a 31 year old Caucasian male who is referred by Jethro Bastos, NP for right testicular pain and dysuria.  Reviewing referral records, patient first experienced right testicular pain in May 2017. A scrotal ultrasound was performed at that time which noted nonspecific predominantly hypoechoic collection along the inferior and lateral margins of the right testicle, most prominent inferiorly, which is nonspecific in appearance but is likely indicative of an acute infectious or inflammatory process. As the epididymal tail is not seen separate from this collection, I suspect this is sequela of an acute epididymitis. Given its nonspecific appearance, recommend follow-up scrotal ultrasound at some point to ensure resolution.  Both testicles appear normal. No evidence of testicular torsion or orchitis.  His urine culture, GC and chlamydia cultures and trichomonas were negative at that time. He was given NSAIDs and an antibiotic.  In February 2018, he was seen in the emergency department for right-sided flank pain and was found to have a small right UVJ stone.  CT Renal stone study noted mild right hydronephrosis and hydroureter secondary to a 3 mm stone in the mid right ureter. There are multiple intrarenal calculi bilaterally.  Calcified gallstone.  More recently, he was seen back at [facility] for right testicular pain and dysuria. Again his cultures were negative. He was given an antibiotic, Pyridium and NSAIDs. His symptoms are not resolved and he was referred to urology.  At his initial visit, he was experiencing frequency and dysuria.  He stated the pain with urination started last Sunday associated with pain in  the left groin.  He also had an episode of left flank pain with bending.  He has not had gross hematuria.  He has not had fevers, chills, nausea or vomiting.  His UA was unremarkable.  His PVR was 0 mL.  He states that his pain is at a level of 6/10 when it occurs.  It only lasts for a few minutes.  Scrotal ultrasound performed on 01/27/2017 noted no explanation for testicular pain.  Lack of visualization of the previously described area of right epididymal hypoechogenicity. This was likely indicative of epididymitis on the prior.  CT Renal stone study performed on 01/27/2017 noted tiny nonobstructive right renal calculus. No evidence of ureteral calculi, hydronephrosis, or other acute findings.  Cholelithiasis.  No radiographic evidence of cholecystitis.  Litholink completed on 02/23/2017 noted extremely low urine volume, marked hypocitraturia, borderline high urine pH and high CaP stone risk.    Since his last visit with [place], he has not had dysuria, gross hematuria or suprapubic pain. He also has not flank pain, fever, chills, nausea or vomiting  PMH: Past Medical History:  Diagnosis Date  . Asthma   . Kidney stone     Surgical History: Past Surgical History:  Procedure Laterality Date  . HERNIA REPAIR      Home Medications:  Allergies as of 03/20/2017   No Known Allergies     Medication List       Accurate as of 03/20/17 11:59 PM. Always use your most recent med list.          docusate sodium 100 MG capsule Commonly known as:  COLACE Take 1 tablet  once or twice daily as needed for constipation while taking narcotic pain medicine   HYDROcodone-acetaminophen 5-325 MG tablet Commonly known as:  NORCO/VICODIN Take 1-2 tablets by mouth every 4 (four) hours as needed for moderate pain.   ondansetron 4 MG disintegrating tablet Commonly known as:  ZOFRAN ODT Allow 1-2 tablets to dissolve in your mouth every 8 hours as needed for nausea/vomiting   tamsulosin 0.4 MG Caps  capsule Commonly known as:  FLOMAX Take 1 tablet by mouth daily until you pass the kidney stone or no longer have symptoms       Allergies: No Known Allergies  Family History: Family History  Problem Relation Age of Onset  . Prostate cancer Neg Hx   . Kidney cancer Neg Hx   . Bladder Cancer Neg Hx     Social History:  reports that he has never smoked. He has never used smokeless tobacco. He reports that he drinks alcohol. He reports that he does not use drugs.  ROS: UROLOGY Frequent Urination?: No Hard to postpone urination?: No Burning/pain with urination?: No Get up at night to urinate?: No Leakage of urine?: No Urine stream starts and stops?: No Trouble starting stream?: No Do you have to strain to urinate?: No Blood in urine?: No Urinary tract infection?: No Sexually transmitted disease?: No Injury to kidneys or bladder?: No Painful intercourse?: No Weak stream?: No Erection problems?: No Penile pain?: No  Gastrointestinal Nausea?: No Vomiting?: No Indigestion/heartburn?: No Diarrhea?: No Constipation?: No  Constitutional Fever: No Night sweats?: No Weight loss?: No Fatigue?: No  Skin Skin rash/lesions?: No Itching?: No  Eyes Blurred vision?: No Double vision?: No  Ears/Nose/Throat Sore throat?: No Sinus problems?: No  Hematologic/Lymphatic Swollen glands?: No Easy bruising?: No  Cardiovascular Leg swelling?: No Chest pain?: No  Respiratory Cough?: No Shortness of breath?: No  Endocrine Excessive thirst?: No  Musculoskeletal Back pain?: No Joint pain?: No  Neurological Headaches?: No Dizziness?: No  Psychologic Depression?: No Anxiety?: No  Physical Exam: BP (!) 149/84 (BP Location: Left Arm, Patient Position: Sitting, Cuff Size: Normal)   Pulse 68   Ht  (1.702 m)   Wt 125 lb 6.4 oz (56.9 kg)   BMI 19.64 kg/m   Constitutional: Well nourished. Alert and oriented, No acute distress. HEENT: [place] AT, moist mucus  membranes. Trachea midline, no masses. Cardiovascular: No clubbing, cyanosis, or edema. Respiratory: Normal respiratory effort, no increased work of breathing. Skin: No rashes, bruises or suspicious lesions. Lymph: No cervical or inguinal adenopathy. Neurologic: Grossly intact, no focal deficits, moving all 4 extremities. Psychiatric: Normal mood and affect.  Laboratory Data: Lab Results  Component Value Date   WBC 10.5 08/25/2016   HGB 15.9 08/25/2016   HCT 46.3 08/25/2016   MCV 85.6 08/25/2016   PLT 217 08/25/2016    Lab Results  Component Value Date   CREATININE 1.14 08/25/2016   I have reviewed the labs.    Assessment & Plan:   1. Extremely low urine volume  - patient encouraged to consume 2.5 L of water daily   2. Marked hypocitraturia  - discussed starting Urocit-K and/or increasing citrate in the diet with citrus fruits and/or juices  - Patient would like to increase his citrus intake through diet  3. Borderline high urine pH  - see above  4. High CaP stone risk  - see above  - Patient does not want to repeat the 24-hour urine study at this time as he is not planning on making  major changes to his diet or behaviors   5. Right renal calculus  - KUB in one year  - Advised to contact our office or seek treatment in the ED if becomes febrile or pain/ vomiting are difficult control in order to arrange for emergent/urgent intervention   Return in about 1 year (around 03/20/2018) for KUB and office visit.  These notes generated with voice recognition software. I apologize for typographical errors.  Michiel Cowboy, PA-C  [facility] [address], [address] [place], [place] [address] [phone]

## 2017-03-20 ENCOUNTER — Ambulatory Visit (INDEPENDENT_AMBULATORY_CARE_PROVIDER_SITE_OTHER): Payer: 59 | Admitting: Urology

## 2017-03-20 ENCOUNTER — Encounter: Payer: Self-pay | Admitting: Urology

## 2017-03-20 ENCOUNTER — Telehealth: Payer: Self-pay | Admitting: Urology

## 2017-03-20 VITALS — BP 149/84 | HR 68 | Ht 67.0 in | Wt 125.4 lb

## 2017-03-20 DIAGNOSIS — R8299 Other abnormal findings in urine: Secondary | ICD-10-CM

## 2017-03-20 DIAGNOSIS — N200 Calculus of kidney: Secondary | ICD-10-CM | POA: Diagnosis not present

## 2017-03-20 DIAGNOSIS — R34 Anuria and oliguria: Secondary | ICD-10-CM | POA: Diagnosis not present

## 2017-03-20 DIAGNOSIS — R8290 Unspecified abnormal findings in urine: Secondary | ICD-10-CM | POA: Diagnosis not present

## 2017-03-20 DIAGNOSIS — R82991 Hypocitraturia: Secondary | ICD-10-CM

## 2017-03-24 NOTE — Telephone Encounter (Signed)
Error

## 2018-01-14 ENCOUNTER — Emergency Department: Payer: 59

## 2018-01-14 ENCOUNTER — Emergency Department
Admission: EM | Admit: 2018-01-14 | Discharge: 2018-01-14 | Disposition: A | Discharge status: Home / Self Care | Payer: 59 | Attending: Emergency Medicine | Admitting: Emergency Medicine

## 2018-01-14 ENCOUNTER — Other Ambulatory Visit: Payer: Self-pay

## 2018-01-14 DIAGNOSIS — N5082 Scrotal pain: Secondary | ICD-10-CM | POA: Diagnosis not present

## 2018-01-14 DIAGNOSIS — J45909 Unspecified asthma, uncomplicated: Secondary | ICD-10-CM | POA: Diagnosis not present

## 2018-01-14 LAB — URINALYSIS, ROUTINE W REFLEX MICROSCOPIC
BILIRUBIN URINE: NEGATIVE
GLUCOSE, UA: NEGATIVE mg/dL
HGB URINE DIPSTICK: NEGATIVE
KETONES UR: NEGATIVE mg/dL
Leukocytes, UA: NEGATIVE
NITRITE: NEGATIVE
PH: 7 (ref 5.0–8.0)
Protein, ur: NEGATIVE mg/dL
SPECIFIC GRAVITY, URINE: 1.024 (ref 1.005–1.030)

## 2018-01-14 LAB — CHLAMYDIA/NGC RT PCR (ARMC ONLY)
CHLAMYDIA TR: NOT DETECTED
N gonorrhoeae: NOT DETECTED

## 2018-01-14 MED ORDER — CIPROFLOXACIN HCL 500 MG PO TABS: 500.0000 mg | ORAL_TABLET | Freq: Two times a day (BID) | ORAL | 0 refills | Status: DC

## 2018-01-14 MED ORDER — CIPROFLOXACIN HCL 500 MG PO TABS
500.0000 mg | ORAL_TABLET | Freq: Once | ORAL | Status: AC
  Administered 2018-01-14: 500 mg via ORAL
  Filled 2018-01-14: qty 1

## 2018-01-14 NOTE — ED Notes (Signed)
No peripheral IV placed this visit.   Discharge instructions reviewed with patient. Questions fielded by this RN. Patient verbalizes understanding of instructions. Patient discharged home in stable condition per sung. No acute distress noted at time of discharge.   

## 2018-01-14 NOTE — ED Triage Notes (Signed)
Patient reports having pain to scrotum.  Reports history of epididymitis.  Was seen at urgent care last week thinking might have a STD, but received results that were negative.

## 2018-01-14 NOTE — ED Notes (Signed)
Lab called for add on 

## 2018-01-14 NOTE — ED Provider Notes (Signed)
[facility] Emergency Department Provider Note   ____________________________________________   First MD Initiated Contact with Patient 01/14/18 [phone]     (approximate)  I have reviewed the triage vital signs and the nursing notes.   HISTORY  Chief Complaint Testicle Pain    HPI Gordon Chang is a 32 y.o. male who presents to the ED from home with a chief complaint of scrotal pain.  Patient reports a history of epididymitis.  Began to have scrotal pain 5 days ago.  Was seen at urgent care with negative STD testing.  He was given IM Rocephin and azithromycin empirically.  Complains of persistent pain which is not worse which is why he presented for evaluation.  Denies fever, chills, chest pain, shortness of breath, abdominal pain, nausea, vomiting, urethral discharge, dysuria.  Denies recent travel or trauma.   Past Medical History:  Diagnosis Date  . Asthma   . Kidney stone     There are no active problems to display for this patient.   Past Surgical History:  Procedure Laterality Date  . HERNIA REPAIR      Prior to Admission medications   Medication Sig Start Date End Date Taking? Authorizing Provider  docusate sodium (COLACE) 100 MG capsule Take 1 tablet once or twice daily as needed for constipation while taking narcotic pain medicine Patient not taking: Reported on 01/16/2017 08/25/16   Loleta Rose, MD  HYDROcodone-acetaminophen (NORCO/VICODIN) 5-325 MG tablet Take 1-2 tablets by mouth every 4 (four) hours as needed for moderate pain. Patient not taking: Reported on 01/16/2017 08/25/16   Loleta Rose, MD  ondansetron (ZOFRAN ODT) 4 MG disintegrating tablet Allow 1-2 tablets to dissolve in your mouth every 8 hours as needed for nausea/vomiting Patient not taking: Reported on 01/16/2017 08/25/16   Loleta Rose, MD  tamsulosin [facility]) 0.4 MG CAPS capsule Take 1 tablet by mouth daily until you pass the kidney stone or no longer have symptoms Patient  not taking: Reported on 01/16/2017 08/25/16   Loleta Rose, MD    Allergies Patient has no known allergies.  Family History  Problem Relation Age of Onset  . Prostate cancer Neg Hx   . Kidney cancer Neg Hx   . Bladder Cancer Neg Hx     Social History Social History   Tobacco Use  . Smoking status: Never Smoker  . Smokeless tobacco: Never Used  Substance Use Topics  . Alcohol use: Yes    Comment: rare  . Drug use: No    Review of Systems  Constitutional: No fever/chills Eyes: No visual changes. ENT: No sore throat. Cardiovascular: Denies chest pain. Respiratory: Denies shortness of breath. Gastrointestinal: No abdominal pain.  No nausea, no vomiting.  No diarrhea.  No constipation. Genitourinary: Positive for bilateral scrotal pain.  Negative for dysuria. Musculoskeletal: Negative for back pain. Skin: Negative for rash. Neurological: Negative for headaches, focal weakness or numbness.   ____________________________________________   PHYSICAL EXAM:  VITAL SIGNS: ED Triage Vitals  Enc Vitals Group     BP 01/14/18 0129 (!) 171/95     Pulse Rate 01/14/18 0129 69     Resp 01/14/18 0129 16     Temp 01/14/18 0129 97.8 F (36.6 C)     Temp Source 01/14/18 0129 Oral     SpO2 01/14/18 0129 99 %     Weight 01/14/18 0128 123 lb (55.8 kg)     Height 01/14/18 0128 5\' 7"  (1.702 m)     Head Circumference --  Peak Flow --      Pain Score 01/14/18 0128 6     Pain Loc --      Pain Edu? --      Excl. in GC? --     Constitutional: Alert and oriented. Well appearing and in no acute distress. Eyes: Conjunctivae are normal. PERRL. EOMI. Head: Atraumatic. Nose: No congestion/rhinnorhea. Mouth/Throat: Mucous membranes are moist.  Oropharynx non-erythematous. Neck: No stridor.   Cardiovascular: Normal rate, regular rhythm. Grossly normal heart sounds.  Good peripheral circulation. Respiratory: Normal respiratory effort.  No retractions. Lungs CTAB. Gastrointestinal:  Soft and nontender. No distention. No abdominal bruits. No CVA tenderness. Genitourinary: Circumcised male.  No urethral discharge.  Bilaterally distended testicles which are slightly tender but not swollen.  Bilaterally strong cremasteric reflexes. Musculoskeletal: No lower extremity tenderness nor edema.  No joint effusions. Neurologic:  Normal speech and language. No gross focal neurologic deficits are appreciated. No gait instability. Skin:  Skin is warm, dry and intact. No rash noted. Psychiatric: Mood and affect are normal. Speech and behavior are normal.  ____________________________________________   LABS (all labs ordered are listed, but only abnormal results are displayed)  Labs Reviewed  URINALYSIS, ROUTINE W REFLEX MICROSCOPIC - Abnormal; Notable for the following components:      Result Value   Color, Urine YELLOW (*)    APPearance CLOUDY (*)    All other components within normal limits  CHLAMYDIA/NGC RT PCR ([facility] ONLY)   ____________________________________________  EKG  None ____________________________________________  RADIOLOGY  ED MD interpretation: Unremarkable scrotal ultrasound  Official radiology report(s): [place] Scrotum  Result Date: 01/14/2018 CLINICAL DATA:  Scrotal pain EXAM: SCROTAL ULTRASOUND DOPPLER ULTRASOUND OF THE TESTICLES TECHNIQUE: Complete ultrasound examination of the testicles, epididymis, and other scrotal structures was performed. Color and spectral Doppler ultrasound were also utilized to evaluate blood flow to the testicles. COMPARISON:  Pelvic ultrasound 01/27/2017 FINDINGS: Right testicle Measurements: 3.5 x 2.1 x 2.6 cm. No mass or microlithiasis visualized. Left testicle Measurements: 3.4 x 1.8 x 2.4 cm. No mass or microlithiasis visualized. Right epididymis:  Normal in size and appearance. Left epididymis:  Normal in size and appearance. Hydrocele:  None visualized. Varicocele:  None visualized. Pulsed Doppler interrogation of  both testes demonstrates normal low resistance arterial and venous waveforms bilaterally. IMPRESSION: Normal testicular ultrasound. Electronically Signed   By: Deatra RobinsonKevin  Herman M.D.   On: 01/14/2018 03:33   [place] Pelvic Doppler (torsion R/o Or Mass Arterial Flow)  Result Date: 01/14/2018 CLINICAL DATA:  Scrotal pain EXAM: SCROTAL ULTRASOUND DOPPLER ULTRASOUND OF THE TESTICLES TECHNIQUE: Complete ultrasound examination of the testicles, epididymis, and other scrotal structures was performed. Color and spectral Doppler ultrasound were also utilized to evaluate blood flow to the testicles. COMPARISON:  Pelvic ultrasound 01/27/2017 FINDINGS: Right testicle Measurements: 3.5 x 2.1 x 2.6 cm. No mass or microlithiasis visualized. Left testicle Measurements: 3.4 x 1.8 x 2.4 cm. No mass or microlithiasis visualized. Right epididymis:  Normal in size and appearance. Left epididymis:  Normal in size and appearance. Hydrocele:  None visualized. Varicocele:  None visualized. Pulsed Doppler interrogation of both testes demonstrates normal low resistance arterial and venous waveforms bilaterally. IMPRESSION: Normal testicular ultrasound. Electronically Signed   By: Deatra RobinsonKevin  Herman M.D.   On: 01/14/2018 03:33    ____________________________________________   PROCEDURES  Procedure(s) performed: None  Procedures  Critical Care performed: No  ____________________________________________   INITIAL IMPRESSION / ASSESSMENT AND PLAN / ED COURSE  As part of my medical decision making, I reviewed the  following data within the electronic MEDICAL RECORD NUMBER Nursing notes reviewed and incorporated, Labs reviewed, Radiograph reviewed and Notes from prior ED visits   32 year old male who presents with a 5-day history of nontraumatic bilateral scrotal pain. Differential diagnosis includes, but is not limited to, acute appendicitis, renal colic, testicular torsion, urinary tract infection/pyelonephritis, prostatitis,   epididymitis, diverticulitis, small bowel obstruction or ileus, colitis, abdominal aortic aneurysm, gastroenteritis, hernia, etc.  Ultrasound and DNA are unremarkable.  Appearance of UA is cloudy; will add urine culture.  Discussed with patient; we will empirically treat with Cipro twice daily for 7 days.  He will follow-up with urology as needed.  Strict return precautions given.  Patient verbalizes understanding and agrees with plan of care.      ____________________________________________   FINAL CLINICAL IMPRESSION(S) / ED DIAGNOSES  Final diagnoses:  Scrotal pain     ED Discharge Orders    None       Note:  This document was prepared using Dragon voice recognition software and may include unintentional dictation errors.    Irean Hong, MD 01/14/18 [phone]

## 2018-01-14 NOTE — Discharge Instructions (Addendum)
You were seen tonight for scrotal pain.  Ultrasound, STD panel and urinalysis were unremarkable.  Urine culture is pending.  We will notify you of any positive results.  In the meantime, please start antibiotic as prescribed (Cipro 500 mg twice daily for 7 days).  Return to the ER for worsening symptoms, persistent vomiting, difficulty breathing or other concerns.

## 2018-01-14 NOTE — ED Notes (Addendum)
Pt reports generalized pain in scrotum, reports hx of unprotected sex and hx of STI,  Pt reports pain diminished since checking in  scrotum appears unremarkable, tender to palpation, and without exceptional warmth, color appropriate to surrounding skin

## 2018-01-14 NOTE — ED Notes (Signed)
Pt waiting patiently in [facility] with one visitor; awake and alert;

## 2018-01-15 LAB — URINE CULTURE: Culture: <10000 — AB

## 2018-01-23 NOTE — Progress Notes (Signed)
01/24/2018 10:51 AM   Gordon Chang 07-19-1985 [id number]  Referring provider: No referring provider defined for this encounter.  Chief Complaint  Patient presents with  . Testicle Pain    HPI: Patient is a 32 year old Caucasian male who is following up from an ED visit for epididymitis.   He was seen in the emergency room on 01/14/2018 after having 5 day of scrotal pain. He was seen at the urgent care prior to this and was given IM Rocephin and azithromycin. He states that STI testing at the urgent care was negative.  Scrotal ultrasound on 01/14/2018 noted normal testicular ultrasound.  UA, urine culture and chlamydia/gonorrhea were negative in the ED.    He was given Cipro and was instructed to follow up with urology.    Today, he is complaining of urinary frequency and painful urination.  He is 90% better since he has been on the Cipro.  Patient denies any gross hematuria, dysuria or suprapubic/flank pain.  Patient denies any fevers, chills, nausea or vomiting.   He has a history of recurrent epididymitis and nephrolithiasis.     PMH: Past Medical History:  Diagnosis Date  . Asthma   . Kidney stone     Surgical History: Past Surgical History:  Procedure Laterality Date  . HERNIA REPAIR      Home Medications:  Allergies as of 01/24/2018   No Known Allergies     Medication List        Accurate as of 01/24/18 10:51 AM. Always use your most recent med list.          doxycycline 100 MG capsule Commonly known as:  VIBRAMYCIN Take 1 capsule (100 mg total) by mouth 2 (two) times daily.       Allergies: No Known Allergies  Family History: Family History  Problem Relation Age of Onset  . Prostate cancer Neg Hx   . Kidney cancer Neg Hx   . Bladder Cancer Neg Hx     Social History:  reports that he has never smoked. He has never used smokeless tobacco. He reports that he drinks alcohol. He reports that he does not use drugs.  ROS: UROLOGY Frequent  Urination?: Yes Hard to postpone urination?: No Burning/pain with urination?: Yes Get up at night to urinate?: No Leakage of urine?: No Urine stream starts and stops?: No Trouble starting stream?: No Do you have to strain to urinate?: No Blood in urine?: No Urinary tract infection?: No Sexually transmitted disease?: No Injury to kidneys or bladder?: No Painful intercourse?: No Weak stream?: No Erection problems?: No Penile pain?: No  Gastrointestinal Nausea?: No Vomiting?: No Indigestion/heartburn?: No Diarrhea?: No Constipation?: No  Constitutional Fever: No Night sweats?: No Weight loss?: No Fatigue?: No  Skin Skin rash/lesions?: No Itching?: No  Eyes Blurred vision?: No Double vision?: No  Ears/Nose/Throat Sore throat?: No Sinus problems?: No  Hematologic/Lymphatic Swollen glands?: No Easy bruising?: No  Cardiovascular Leg swelling?: No Chest pain?: No  Respiratory Cough?: No Shortness of breath?: No  Endocrine Excessive thirst?: No  Musculoskeletal Back pain?: No Joint pain?: No  Neurological Headaches?: No Dizziness?: No  Psychologic Depression?: No Anxiety?: No  Physical Exam: BP (!) 156/97 (BP Location: Left Arm, Patient Position: Sitting, Cuff Size: Normal)   Pulse 77   Ht 5\' 7"  (1.702 m)   Wt 123 lb 6.4 oz (56 kg)   BMI 19.33 kg/m   Constitutional:  Well nourished. Alert and oriented, No acute distress. HEENT: [place] AT,  moist mucus membranes.  Trachea midline, no masses. Cardiovascular: No clubbing, cyanosis, or edema. Respiratory: Normal respiratory effort, no increased work of breathing. GI: Abdomen is soft, non tender, non distended, no abdominal masses. Liver and spleen not palpable.  No hernias appreciated.  Stool sample for occult testing is not indicated.   GU: No CVA tenderness.  No bladder fullness or masses.  Patient with circumcised phallus.   Urethral meatus is patent.  No penile discharge. No penile lesions or rashes.  Scrotum without lesions, cysts, rashes and/or edema.  Testicles are located scrotally bilaterally. No masses are appreciated in the testicles. Left and right epididymis are normal. Rectal: Not performed Skin: No rashes, bruises or suspicious lesions. Lymph: No cervical or inguinal adenopathy. Neurologic: Grossly intact, no focal deficits, moving all 4 extremities. Psychiatric: Normal mood and affect.  Laboratory Data: Lab Results  Component Value Date   WBC 10.5 08/25/2016   HGB 15.9 08/25/2016   HCT 46.3 08/25/2016   MCV 85.6 08/25/2016   PLT 217 08/25/2016    Lab Results  Component Value Date   CREATININE 1.14 08/25/2016    No results found for: PSA  No results found for: TESTOSTERONE  No results found for: HGBA1C  No results found for: TSH  No results found for: CHOL, HDL, CHOLHDL, VLDL, LDLCALC  No results found for: AST No results found for: ALT No components found for: ALKALINEPHOPHATASE No components found for: BILIRUBINTOTAL  No results found for: ESTRADIOL  Urinalysis    Component Value Date/Time   COLORURINE YELLOW (A) 01/14/2018 0133   APPEARANCEUR CLOUDY (A) 01/14/2018 0133   APPEARANCEUR Clear 01/16/2017 0820   LABSPEC 1.024 01/14/2018 0133   PHURINE 7.0 01/14/2018 0133   GLUCOSEU NEGATIVE 01/14/2018 0133   HGBUR NEGATIVE 01/14/2018 0133   BILIRUBINUR NEGATIVE 01/14/2018 0133   BILIRUBINUR Negative 01/16/2017 0820   KETONESUR NEGATIVE 01/14/2018 0133   PROTEINUR NEGATIVE 01/14/2018 0133   NITRITE NEGATIVE 01/14/2018 0133   LEUKOCYTESUR NEGATIVE 01/14/2018 0133   LEUKOCYTESUR Negative 01/16/2017 0820    I have reviewed the labs.   Pertinent Imaging: CLINICAL DATA:  Scrotal pain  EXAM: SCROTAL ULTRASOUND  DOPPLER ULTRASOUND OF THE TESTICLES  TECHNIQUE: Complete ultrasound examination of the testicles, epididymis, and other scrotal structures was performed. Color and spectral Doppler ultrasound were also utilized to evaluate blood  flow to the testicles.  COMPARISON:  Pelvic ultrasound 01/27/2017  FINDINGS: Right testicle  Measurements: 3.5 x 2.1 x 2.6 cm. No mass or microlithiasis visualized.  Left testicle  Measurements: 3.4 x 1.8 x 2.4 cm. No mass or microlithiasis visualized.  Right epididymis:  Normal in size and appearance.  Left epididymis:  Normal in size and appearance.  Hydrocele:  None visualized.  Varicocele:  None visualized.  Pulsed Doppler interrogation of both testes demonstrates normal low resistance arterial and venous waveforms bilaterally.  IMPRESSION: Normal testicular ultrasound.   Electronically Signed   By: Deatra Robinson M.D.   On: 01/14/2018 03:33 I have independently reviewed the films.    Assessment & Plan:   1. Epididymitis Resolving at this time but he only feels at 90%, he has been prescribed Cipro for one week so will finish the second week with doxycyline as he feels this is more effective Advised to contact our office or seek treatment in the ED if becomes febrile or pain/ vomiting are difficult control in order to arrange for emergent/urgent intervention  Return for keep appointment in September .  These notes generated with voice recognition  software. I apologize for typographical errors.  Zara Council, PA-C  [facility] [address]  [place], [place] [address] [phone]

## 2018-01-24 ENCOUNTER — Encounter: Payer: Self-pay | Admitting: Urology

## 2018-01-24 ENCOUNTER — Ambulatory Visit (INDEPENDENT_AMBULATORY_CARE_PROVIDER_SITE_OTHER): Payer: 59 | Admitting: Urology

## 2018-01-24 VITALS — BP 156/97 | HR 77 | Ht 67.0 in | Wt 123.4 lb

## 2018-01-24 DIAGNOSIS — N451 Epididymitis: Secondary | ICD-10-CM | POA: Diagnosis not present

## 2018-01-24 MED ORDER — DOXYCYCLINE HYCLATE 100 MG PO CAPS: 100.0000 mg | ORAL_CAPSULE | Freq: Two times a day (BID) | ORAL | 0 refills | Status: DC

## 2018-01-31 ENCOUNTER — Ambulatory Visit: Payer: 59 | Admitting: Urology

## 2018-03-16 ENCOUNTER — Telehealth: Payer: Self-pay | Admitting: Urology

## 2018-03-19 ENCOUNTER — Ambulatory Visit: Payer: Self-pay | Admitting: Urology

## 2018-04-02 NOTE — Telephone Encounter (Signed)
error 

## 2018-05-30 IMAGING — CT CT RENAL STONE PROTOCOL
1 of 2 series · 15 of 32 positions shown, 19 images · non-contrast
Comparison: 08/25/2016

CLINICAL DATA: Left flank pain radiating to left groin for 2 weeks.
Nephrolithiasis.

EXAM:
CT ABDOMEN AND PELVIS WITHOUT CONTRAST
TECHNIQUE: Multidetector CT imaging of the abdomen and pelvis was performed
following the standard protocol without IV contrast.

[Series 2: axial st · axial · 0.59mm/px · z∈[-974,-649]mm · 15 of 74 slices shown, 19 images]
[im 6/74  soft-tissue]
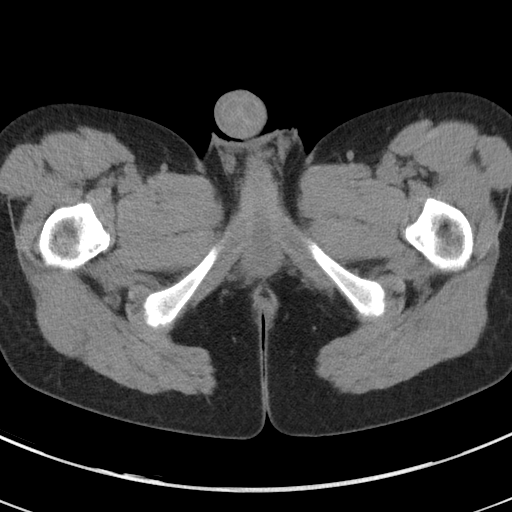
[im 6/74  bone]
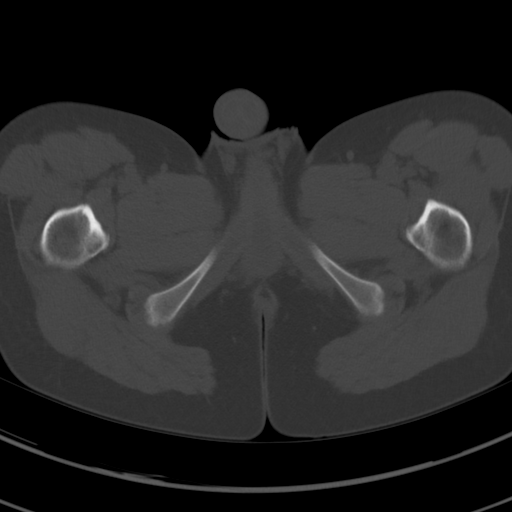
[im 11/74  soft-tissue]
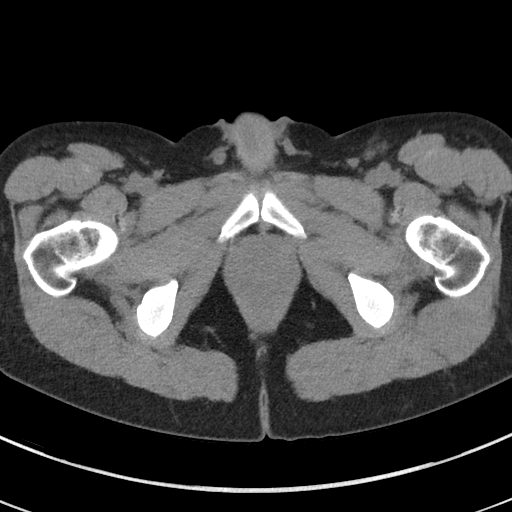
[im 16/74  soft-tissue]
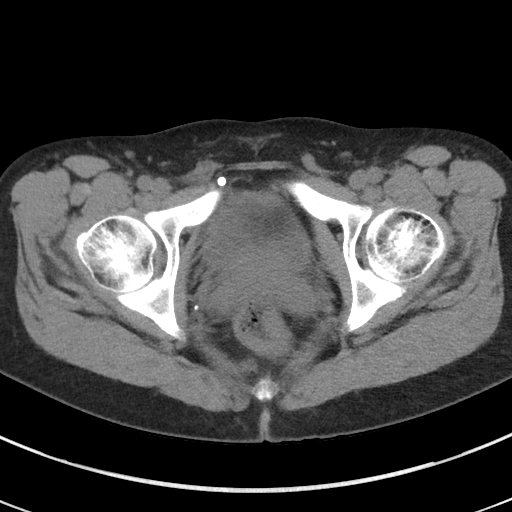
[im 21/74  soft-tissue]
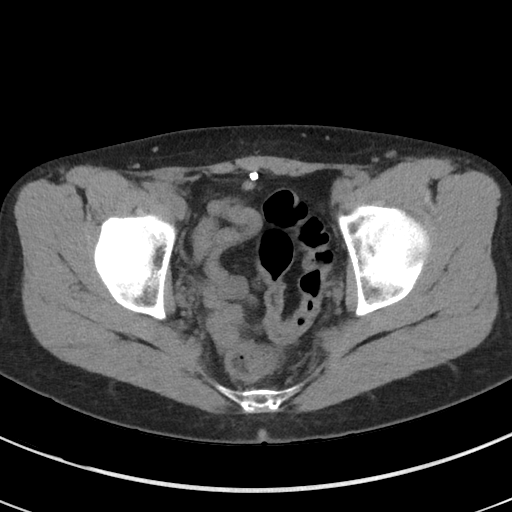
[im 27/74  soft-tissue]
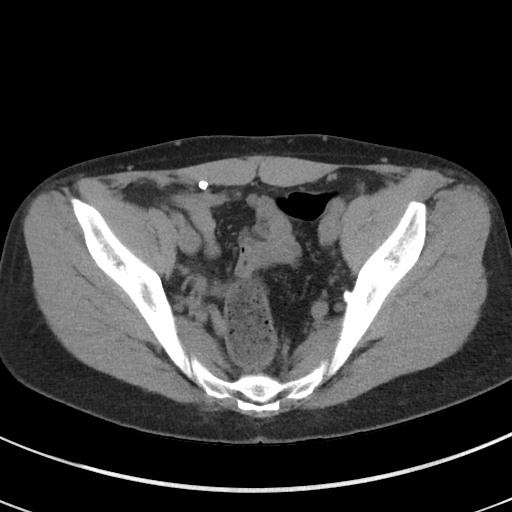
[im 32/74  soft-tissue]
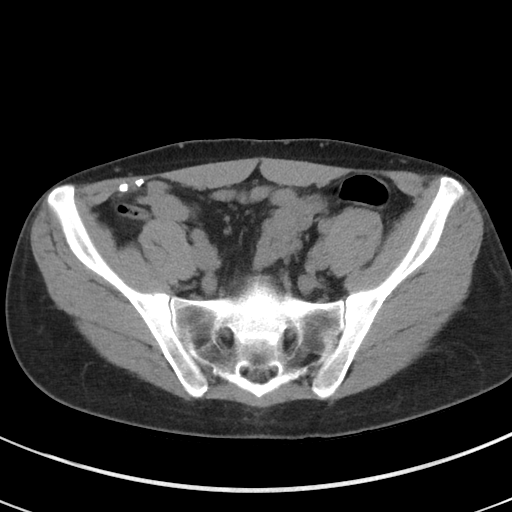
[im 37/74  soft-tissue]
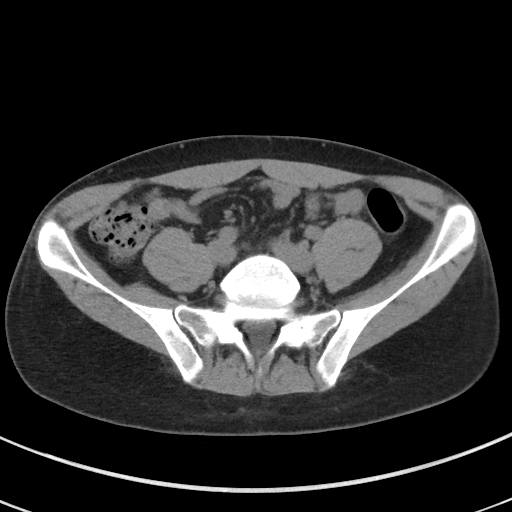
[im 42/74  soft-tissue]
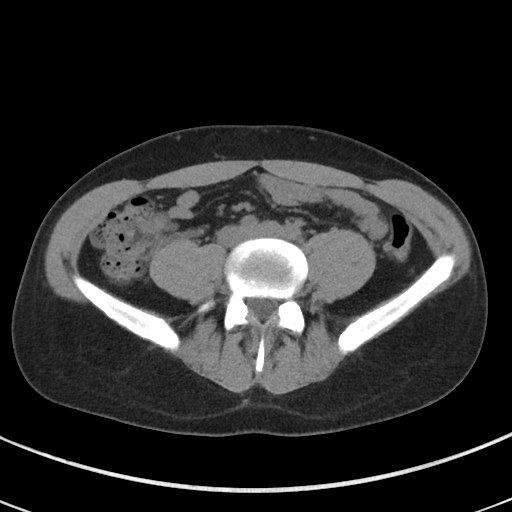
[im 47/74  soft-tissue]
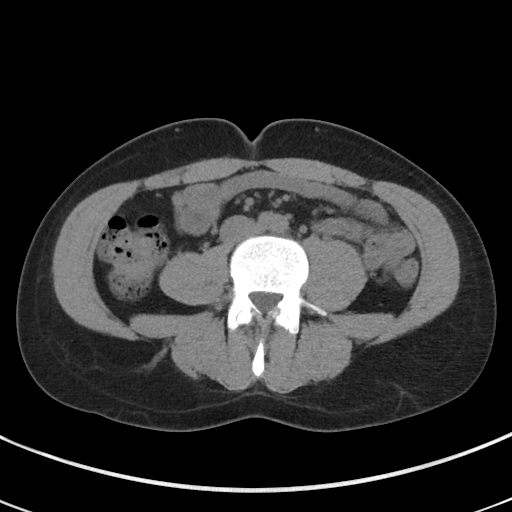
[im 47/74  bone]
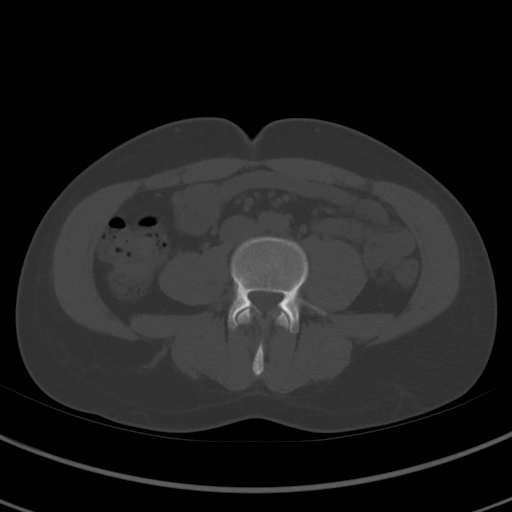
[im 53/74  soft-tissue]
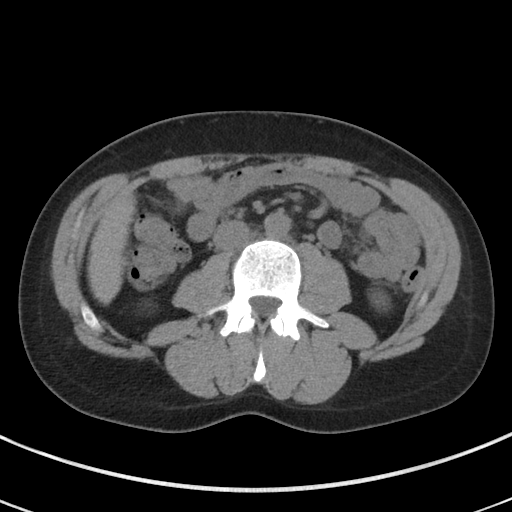
[im 58/74  soft-tissue]
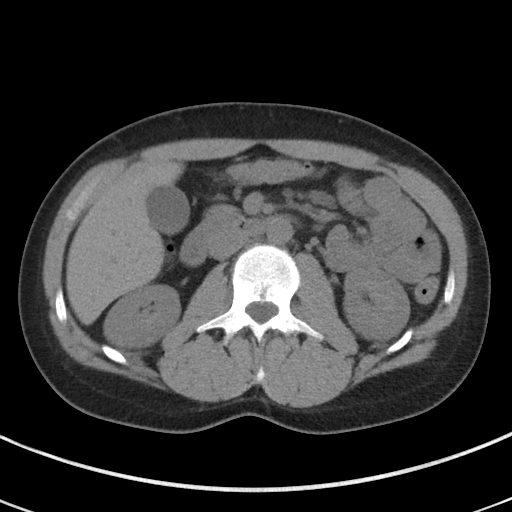
[im 63/74  soft-tissue]
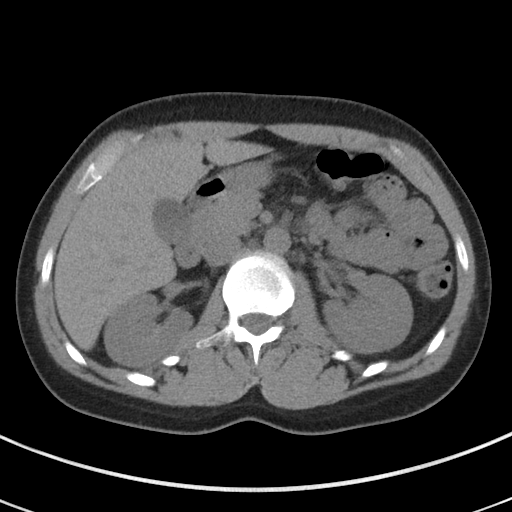
[im 63/74  lung]
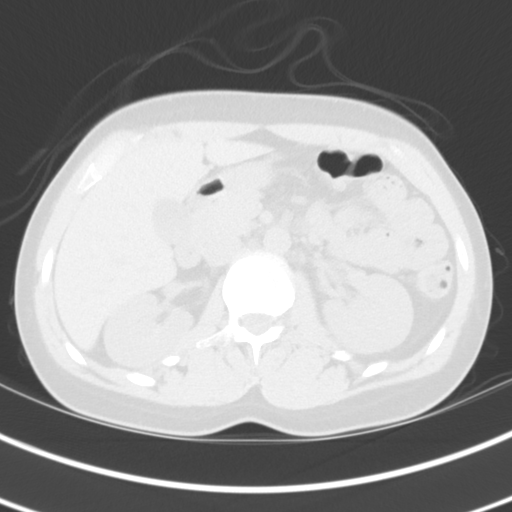
[im 66/74  lung]
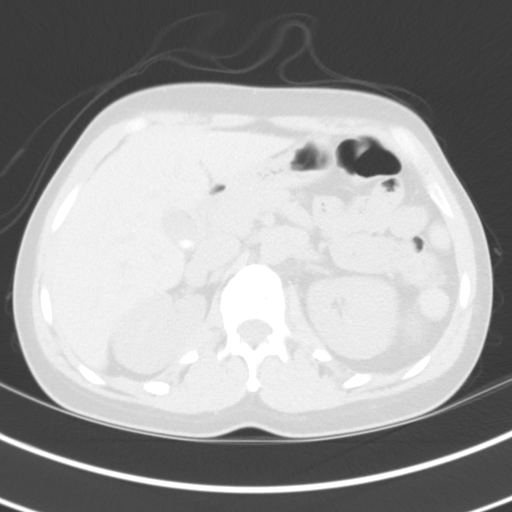
[im 68/74  soft-tissue]
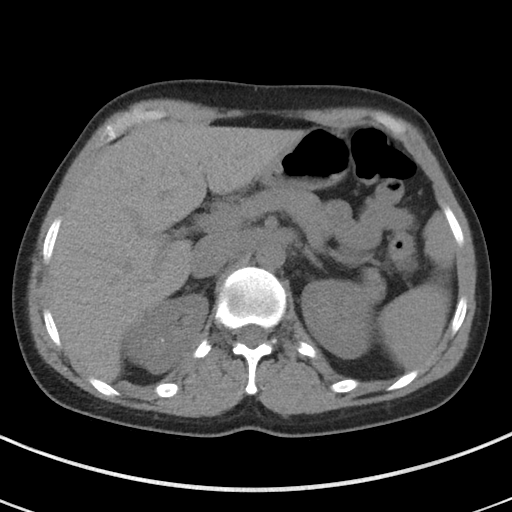
[im 68/74  lung]
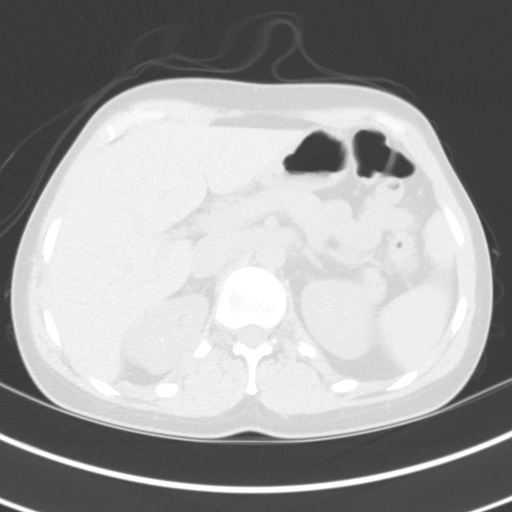
[im 71/74  lung]
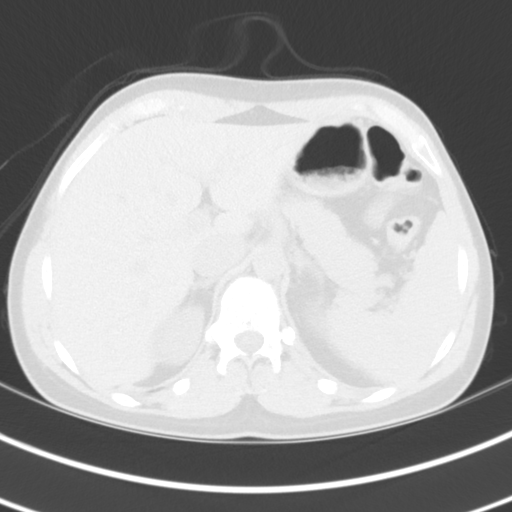

[15 of 32 positions shown; findings below may reference images not displayed]

FINDINGS: Lower chest: No acute findings.

Hepatobiliary: No masses visualized on this unenhanced exam. Small
calcified gallstone again seen, without evidence of cholecystitis.

Pancreas: No mass or inflammatory process visualized on this
unenhanced exam.

Spleen:  Within normal limits in size.

Adrenals/Urinary tract: A 2 mm nonobstructive calculus is seen in
the upper pole of the right kidney. No evidence of ureteral calculi
or hydronephrosis involving either kidney. Empty urinary bladder.

Stomach/Bowel: No evidence of obstruction, inflammatory process, or
abnormal fluid collections. Normal appendix visualized.

Vascular/Lymphatic: No pathologically enlarged lymph nodes
identified. No evidence of abdominal aortic aneurysm.

Reproductive:  No mass or other significant abnormality.

Other: Surgical mesh seen in the right inguinal region, without
evidence of recurrent hernia.

Musculoskeletal:  No suspicious bone lesions identified.
IMPRESSION: Tiny nonobstructive right renal calculus. No evidence of ureteral
calculi, hydronephrosis, or other acute findings.

Cholelithiasis.  No radiographic evidence of cholecystitis.

## 2018-11-20 ENCOUNTER — Other Ambulatory Visit: Payer: Self-pay

## 2018-11-20 ENCOUNTER — Other Ambulatory Visit: Payer: 59

## 2018-11-20 ENCOUNTER — Telehealth: Payer: Self-pay | Admitting: Urology

## 2018-11-20 DIAGNOSIS — N50819 Testicular pain, unspecified: Secondary | ICD-10-CM

## 2018-11-20 LAB — MICROSCOPIC EXAMINATION
Bacteria, UA: NONE SEEN
Epithelial Cells (non renal): NONE SEEN /hpf (ref 0–10)
RBC: NONE SEEN /hpf (ref 0–2)

## 2018-11-20 LAB — URINALYSIS, COMPLETE
Bilirubin, UA: NEGATIVE
Specific Gravity, UA: 1.02 (ref 1.005–1.030)
pH, UA: 7 (ref 5.0–7.5)

## 2018-11-20 NOTE — Telephone Encounter (Signed)
He will need to be seen in the office so we can do an exam and cultures.  If he develops fevers/chills, scrotal swelling and/or nausea/vomitng prior to his appointment, he needs to go to urgent care or the ED.  We can check an UA, Ucx, GC/chlamydia/mycoplasma today.

## 2018-11-20 NOTE — Telephone Encounter (Signed)
Please schedule thanks, placing orders for labs today

## 2018-11-20 NOTE — Telephone Encounter (Signed)
Which would you prefer thaks

## 2018-11-20 NOTE — Telephone Encounter (Signed)
Patient called stating that he thinks he may have epididymis again. He started having pain in his testicle as of last night, no swelling and frequency. Does he need to be seen or can this be a virtual app? Please advise   Gordon Chang

## 2018-11-23 LAB — CULTURE, URINE COMPREHENSIVE

## 2018-11-25 LAB — GC/CHLAMYDIA PROBE AMP
Chlamydia trachomatis, NAA: NEGATIVE
Neisseria Gonorrhoeae by PCR: NEGATIVE

## 2018-11-28 ENCOUNTER — Ambulatory Visit (INDEPENDENT_AMBULATORY_CARE_PROVIDER_SITE_OTHER): Payer: 59 | Admitting: Urology

## 2018-11-28 ENCOUNTER — Encounter: Payer: Self-pay | Admitting: Urology

## 2018-11-28 ENCOUNTER — Other Ambulatory Visit: Payer: Self-pay

## 2018-11-28 VITALS — BP 167/117 | HR 105 | Ht 67.0 in | Wt 122.0 lb

## 2018-11-28 DIAGNOSIS — N50819 Testicular pain, unspecified: Secondary | ICD-10-CM

## 2018-11-28 DIAGNOSIS — N411 Chronic prostatitis: Secondary | ICD-10-CM

## 2018-11-28 LAB — URINALYSIS, COMPLETE
Bilirubin, UA: NEGATIVE
Glucose, UA: NEGATIVE
Ketones, UA: NEGATIVE
Leukocytes,UA: NEGATIVE
Nitrite, UA: NEGATIVE
Protein,UA: NEGATIVE
RBC, UA: NEGATIVE
Specific Gravity, UA: 1.015 (ref 1.005–1.030)
Urobilinogen, Ur: 0.2 mg/dL (ref 0.2–1.0)
pH, UA: 8.5 — ABNORMAL HIGH (ref 5.0–7.5)

## 2018-11-28 LAB — MICROSCOPIC EXAMINATION
Bacteria, UA: NONE SEEN
Epithelial Cells (non renal): NONE SEEN /hpf (ref 0–10)
RBC: NONE SEEN /hpf (ref 0–2)

## 2018-11-28 MED ORDER — TAMSULOSIN HCL 0.4 MG PO CAPS: 0.4000 mg | ORAL_CAPSULE | Freq: Every day | ORAL | 0 refills | Status: DC

## 2018-11-28 NOTE — Progress Notes (Signed)
11/28/2018 1:31 PM   Gordon DurandBrian Chang 1985/09/18 [id number]  Referring provider: No referring provider defined for this encounter.  Chief Complaint  Patient presents with  . Testicle Pain    HPI: 33 year old male who presents today with complaints of dysuria, pelvic pain, and testicular pain.  He reports that approximately 10 days ago, he started experiencing dull aching testicular pain which was bilateral.  He does have a personal history of epididymitis and was initially worried that he was having a recurrent episode of epididymitis.  The pain then also began to radiate to his lower abdomen and down his penile shaft.  He has associated mild dysuria with some urgency frequency as well which began several days after the initial onset of his symptoms.  He did come our office on 11/20/2018 at which time his urine was unremarkable (red from Pyridium) as well as negative GC and chlamydia.  Urine culture is also negative.  He did have calling tele-doc and completing a 7-day course of Keflex which he finished yesterday.  Today, he reports that his symptoms are improving.  He continues to have a dull aching in his penis with voiding but otherwise is asymptomatic.  No fevers or chills.  His urine today is negative.  He denies any exacerbating symptoms.  He reports that his previous episodes of epididymitis related to sexual activity but he is recently been abstinent.  He does also note that he has a personal history of kidney stones this is not feeling a kidney stone episode as he has no flank pain.   PMH: Past Medical History:  Diagnosis Date  . Asthma   . Kidney stone     Surgical History: Past Surgical History:  Procedure Laterality Date  . HERNIA REPAIR      Home Medications:  Allergies as of 11/28/2018   No Known Allergies     Medication List       Accurate as of Nov 28, 2018  1:31 PM. If you have any questions, ask your nurse or doctor.        doxycycline 100 MG  capsule Commonly known as:  VIBRAMYCIN Take 1 capsule (100 mg total) by mouth 2 (two) times daily.   tamsulosin 0.4 MG Caps capsule Commonly known as:  Flomax Take 1 capsule (0.4 mg total) by mouth daily. Started by:  Vanna ScotlandAshley Darrick Greenlaw, MD       Allergies: No Known Allergies  Family History: Family History  Problem Relation Age of Onset  . Prostate cancer Neg Hx   . Kidney cancer Neg Hx   . Bladder Cancer Neg Hx     Social History:  reports that he has never smoked. He has never used smokeless tobacco. He reports current alcohol use. He reports that he does not use drugs.  ROS: UROLOGY Frequent Urination?: Yes Hard to postpone urination?: No Burning/pain with urination?: Yes Get up at night to urinate?: No Leakage of urine?: No Urine stream starts and stops?: No Trouble starting stream?: No Do you have to strain to urinate?: No Blood in urine?: No Urinary tract infection?: Yes Sexually transmitted disease?: No Injury to kidneys or bladder?: No Painful intercourse?: No Weak stream?: No Erection problems?: No Penile pain?: No  Gastrointestinal Nausea?: No Vomiting?: No Indigestion/heartburn?: No Diarrhea?: No Constipation?: No  Constitutional Fever: No Night sweats?: No Weight loss?: No Fatigue?: No  Skin Skin rash/lesions?: No Itching?: No  Eyes Blurred vision?: No Double vision?: No  Ears/Nose/Throat Sore throat?: No Sinus problems?: No  Hematologic/Lymphatic Swollen glands?: No Easy bruising?: No  Cardiovascular Leg swelling?: No Chest pain?: No  Respiratory Cough?: No Shortness of breath?: No  Endocrine Excessive thirst?: No  Musculoskeletal Back pain?: No Joint pain?: No  Neurological Headaches?: No Dizziness?: No  Psychologic Depression?: No Anxiety?: No  Physical Exam: BP (!) 167/117   Pulse (!) 105   Ht 5\' 7"  (1.702 m)   Wt 122 lb (55.3 kg)   BMI 19.11 kg/m   Constitutional:  Alert and oriented, No acute  distress. HEENT: [place] AT, moist mucus membranes.  Trachea midline, no masses. Cardiovascular: No clubbing, cyanosis, or edema. Respiratory: Normal respiratory effort, no increased work of breathing. GI: Abdomen is soft, nontender, nondistended, no abdominal masses GU: Bilateral testicles descended, normal without masses, nontender epididymis x2.  Normal circumcised phallus with orthotopic meatus.  No drainage. Lymph: No inguinal lymphadenopathy. Skin: No rashes, bruises or suspicious lesions. Neurologic: Grossly intact, no focal deficits, moving all 4 extremities. Psychiatric: Normal mood and affect.  Laboratory Data: Lab Results  Component Value Date   WBC 10.5 08/25/2016   HGB 15.9 08/25/2016   HCT 46.3 08/25/2016   MCV 85.6 08/25/2016   PLT 217 08/25/2016    Lab Results  Component Value Date   CREATININE 1.14 08/25/2016   Urinalysis UA today negative, see epic.  UA/urine culture/GC chlamydia from 590 was also reviewed and negative.  Pertinent Imaging: n/a  Assessment & Plan:    1. Chronic prostatitis Patient symptoms are more consistent with an inflammatory prostatitis with the presence of some mild dysuria and lower abdominal pain as well as bilateral testicular pain rather than epididymitis  Exam today is unremarkable  Given that he is recently completed antibiotics as of yesterday and is improving, I see no need for further antibiotics at this time.  I have recommended supportive care, anti-inflammatories, and Flomax which was sent to his pharmacy.  He was advised if his symptoms fail to resolve, he can call our office by Friday we can call him in some doxycycline as this previously helped.  2. Testicle pain As above, do not suspect epididymitis - Urinalysis, Complete    Vanna Scotland, MD  [facility] [address], [address] [place], [place] [address] [phone]

## 2018-12-04 ENCOUNTER — Telehealth: Payer: Self-pay | Admitting: Urology

## 2018-12-04 MED ORDER — DOXYCYCLINE HYCLATE 100 MG PO CAPS: 100.0000 mg | ORAL_CAPSULE | Freq: Two times a day (BID) | ORAL | 0 refills | Status: DC

## 2018-12-04 NOTE — Telephone Encounter (Signed)
Patient called the office today to report that he is feeling somewhat better, but not as well as he hoped.   He does not think that his symptoms have resolved.  He is still having increased frequency, getting up at night almost every hour to urinate.    Per Dr. Delana Meyer office note:  "He was advised if his symptoms fail to resolve, he can call our office by Friday we can call him in some doxycycline as this previously helped."  [facility] uses the [facility] on [address]. He can be reached at [phone].

## 2018-12-04 NOTE — Telephone Encounter (Signed)
Spoke to patient and Doxy was sent to pharmacy per Dr. Delana Meyer last office visit note.

## 2018-12-13 ENCOUNTER — Telehealth: Payer: Self-pay | Admitting: Urology

## 2018-12-13 MED ORDER — MELOXICAM 5 MG PO CAPS: 5.0000 mg | ORAL_CAPSULE | Freq: Every day | ORAL | 0 refills | Status: DC

## 2018-12-13 NOTE — Telephone Encounter (Signed)
Informed patient-verbalized understanding.  He is aware someone will call him with an appointment date and time for a TRUS.

## 2018-12-13 NOTE — Telephone Encounter (Signed)
Let us try him on a round of meloxicam, and wear supportive underwear.  Also, schedule him to come see [place] again in office possibly for a transrectal ultrasound to assess for possible underlying prostatic abscess.  When he is the night chills, please have him check his temperature to see if he has a fever.  Hollice Espy, MD

## 2018-12-13 NOTE — Telephone Encounter (Signed)
Patient called to inform the doctor course of doxycycline yesterday, with minimal change in symptoms. Pt. reports lower abdominal pain, night chills, and frequent urination. Pt. Asserts he was told to call back if the medication was ineffective.   Best Number: [phone]

## 2018-12-14 ENCOUNTER — Telehealth: Payer: Self-pay | Admitting: Urology

## 2018-12-14 NOTE — Telephone Encounter (Signed)
Per [facility] the RX Meloxicam 5mg  that was sent to pharmacy costs 400.00 with insurance because this has to be filled as brand name. They are asking if it can be sent as 7.5mg . Please advise

## 2018-12-14 NOTE — Telephone Encounter (Signed)
Pt called and states that he went to get his rx of Meloxicam and it was $400.00 w/ insurance. He would like to know if we could call in something else for him to try.Please Advise.

## 2018-12-14 NOTE — Telephone Encounter (Signed)
Ok to change script.  Gordon Espy, MD

## 2018-12-14 NOTE — Telephone Encounter (Signed)
RX changed 

## 2018-12-14 NOTE — Telephone Encounter (Signed)
Spoke to patient and informed him to have them run it without insurance at pharmacy and he could use Good RX coupon.

## 2018-12-18 ENCOUNTER — Other Ambulatory Visit: Payer: Self-pay | Admitting: *Deleted

## 2018-12-18 MED ORDER — MELOXICAM 7.5 MG PO TABS: 7.5000 mg | ORAL_TABLET | Freq: Every day | ORAL | 0 refills | Status: DC

## 2018-12-25 ENCOUNTER — Other Ambulatory Visit: Payer: Self-pay | Admitting: Urology

## 2018-12-25 MED ORDER — TAMSULOSIN HCL 0.4 MG PO CAPS: 0.4000 mg | ORAL_CAPSULE | Freq: Every day | ORAL | 0 refills | Status: DC

## 2018-12-25 NOTE — Telephone Encounter (Signed)
Only wanted him to have 1 week of Mobic because this can be hard on his GI tract and kidneys.  It is fine to refill the Flomax.  Hollice Espy, MD

## 2018-12-25 NOTE — Telephone Encounter (Signed)
Patient notified Flomax is refilled and he is not to get Mobic at this time. Patient voiced understanding.

## 2018-12-25 NOTE — Telephone Encounter (Signed)
Pt will run out of both RX Mobic and Flomax on Friday.  He has an appt next Wednesday.  Can you refill enough to last til appt.

## 2019-01-02 ENCOUNTER — Other Ambulatory Visit: Payer: Self-pay

## 2019-01-02 ENCOUNTER — Encounter: Payer: Self-pay | Admitting: Urology

## 2019-01-02 ENCOUNTER — Ambulatory Visit (INDEPENDENT_AMBULATORY_CARE_PROVIDER_SITE_OTHER): Payer: 59 | Admitting: Urology

## 2019-01-02 VITALS — BP 157/113 | HR 111 | Ht 67.0 in | Wt 121.0 lb

## 2019-01-02 DIAGNOSIS — N411 Chronic prostatitis: Secondary | ICD-10-CM

## 2019-01-02 LAB — MICROSCOPIC EXAMINATION
Bacteria, UA: NONE SEEN
Epithelial Cells (non renal): NONE SEEN /hpf (ref 0–10)
RBC, Urine: NONE SEEN /hpf (ref 0–2)

## 2019-01-02 LAB — URINALYSIS, COMPLETE
Bilirubin, UA: NEGATIVE
Glucose, UA: NEGATIVE
Leukocytes,UA: NEGATIVE
Nitrite, UA: NEGATIVE
RBC, UA: NEGATIVE
Specific Gravity, UA: >1.03 — ABNORMAL HIGH (ref 1.005–1.030)
Urobilinogen, Ur: 1 mg/dL (ref 0.2–1.0)
pH, UA: 5.5 (ref 5.0–7.5)

## 2019-01-02 NOTE — Progress Notes (Signed)
01/02/19    HPI: 33 year old male with chronic pelvic pain, recurrent episodes of prostatitis who presents today TRUS prostate ultrasound to rule out possible underlying abscess.  Overall, he reports that he actually is improving on Flomax and meloxicam.  Please see previous notes for details.     Blood pressure (!) 157/113, pulse (!) 111, height 5\' 7"  (1.702 m), weight 121 lb (54.9 kg). NED. A&Ox3.   No respiratory distress     Prostate transrectal ultrasound    Informed consent was obtained after discussing risks/benefits of the procedure.  A time out was performed to ensure correct patient identity.   Pre-Procedure: -Transrectal probe was placed without difficulty -Transrectal Ultrasound performed revealing a 19.9 gm prostate measuring 2.39 x 2.39 x 4.29 cm (length) -No significant hypoechoic or median lobe noted  -No prostate abscesses identified -Nonspecific prostatic calcifications appreciated   Assessment/ Plan:  1. Chronic prostatitis Improving on Flomax and NSAIDs Advised him to stop the NSAIDs due to concern for kidney/GI distress No evidence of prostatic abscess Advised to follow-up as needed - Urinalysis, Complete   Hollice Espy, MD

## 2019-01-07 ENCOUNTER — Telehealth: Payer: Self-pay | Admitting: Urology

## 2019-01-07 DIAGNOSIS — R300 Dysuria: Secondary | ICD-10-CM

## 2019-01-07 NOTE — Telephone Encounter (Signed)
Patient notified UA drop off scheduled, order placed

## 2019-01-07 NOTE — Telephone Encounter (Signed)
This is likely residual irritation from they cysto which is not unexpected given your history.  I would have him come by drop of urine but suspect will be normal.  Encourage hydration and Pyridium as needed.  Hollice Espy, MD

## 2019-01-07 NOTE — Telephone Encounter (Signed)
Pt called and states that he has been having stomach pains (around the navel) since the procedure on 01/02/2019. He states that he is having burning with urination. Please advise.

## 2019-01-08 ENCOUNTER — Other Ambulatory Visit: Payer: 59

## 2019-01-08 ENCOUNTER — Other Ambulatory Visit: Payer: Self-pay

## 2019-01-08 DIAGNOSIS — R300 Dysuria: Secondary | ICD-10-CM

## 2019-01-08 NOTE — Progress Notes (Signed)
Patient notified per Dr. Erlene Quan UA today is clear and utilize Mobic for three days to help with discomfort. If pain continues we may possibly refer to pelvic floor therapy

## 2019-01-09 LAB — URINALYSIS, COMPLETE
Bilirubin, UA: NEGATIVE
Glucose, UA: NEGATIVE
Ketones, UA: NEGATIVE
Leukocytes,UA: NEGATIVE
Nitrite, UA: NEGATIVE
RBC, UA: NEGATIVE
Specific Gravity, UA: 1.02 (ref 1.005–1.030)
Urobilinogen, Ur: 1 mg/dL (ref 0.2–1.0)
pH, UA: 8.5 — ABNORMAL HIGH (ref 5.0–7.5)

## 2019-01-09 LAB — MICROSCOPIC EXAMINATION
Epithelial Cells (non renal): NONE SEEN /hpf (ref 0–10)
RBC, Urine: NONE SEEN /hpf (ref 0–2)
WBC, UA: NONE SEEN /hpf (ref 0–5)

## 2019-01-17 ENCOUNTER — Other Ambulatory Visit: Payer: Self-pay | Admitting: Urology

## 2019-02-05 ENCOUNTER — Telehealth: Payer: Self-pay | Admitting: Urology

## 2019-02-05 DIAGNOSIS — N411 Chronic prostatitis: Secondary | ICD-10-CM

## 2019-02-05 NOTE — Telephone Encounter (Signed)
Aggree with PT.  Order placed.  Hollice Espy, MD

## 2019-02-05 NOTE — Telephone Encounter (Signed)
Patient called stated that he is now having pain with urination and frequency, he did not finish is ABX from last time and wants to know if he should start taking it again? This all started a few days ago. He says it's the same as last time he was seen.   [name]

## 2019-02-05 NOTE — Telephone Encounter (Signed)
Spoke to patient and he states he was given Flomax, not and ABX. He has been taking it for the last 2 days and his symptoms are starting to resolve. He states at his last visit there was mention of Physical therapy. He is will ing to see someone for this. Is it ok to put it the referral?

## 2019-03-07 ENCOUNTER — Other Ambulatory Visit: Payer: Self-pay

## 2019-03-07 ENCOUNTER — Ambulatory Visit: Payer: 59 | Attending: Urology | Admitting: Physical Therapy

## 2019-03-07 ENCOUNTER — Encounter: Payer: Self-pay | Admitting: Physical Therapy

## 2019-03-07 DIAGNOSIS — R2689 Other abnormalities of gait and mobility: Secondary | ICD-10-CM | POA: Diagnosis not present

## 2019-03-07 DIAGNOSIS — M533 Sacrococcygeal disorders, not elsewhere classified: Secondary | ICD-10-CM | POA: Insufficient documentation

## 2019-03-07 DIAGNOSIS — R278 Other lack of coordination: Secondary | ICD-10-CM | POA: Diagnosis present

## 2019-03-07 DIAGNOSIS — M62838 Other muscle spasm: Secondary | ICD-10-CM | POA: Insufficient documentation

## 2019-03-07 NOTE — Therapy (Addendum)
[place] MAIN [facility] SERVICES [address] [place], [place], [address] Phone: [phone]   Fax:  [phone]  Physical Therapy Evaluation  Patient Details  Name: Gordon Chang MRN: [id number] Date of Birth: July 30, 1985 Referring Provider (PT): Erlene Quan   Encounter Date: 03/07/2019  PT End of Session - 03/07/19 1801    Visit Number  1    Number of Visits  10    Date for PT Re-Evaluation  05/16/19    PT Start Time  1302    PT Stop Time  1400    PT Time Calculation (min)  58 min       Past Medical History:  Diagnosis Date  . Asthma   . Kidney stone     Past Surgical History:  Procedure Laterality Date  . HERNIA REPAIR      There were no vitals filed for this visit.   Subjective Assessment - 03/07/19 1308    Subjective  Pt experienced lower abdominal pain L in March 2020. Dull/ sharp pain suddenly came on, pt thought he had a kidney stone. 8/10 pain. Pt was put on medication which helped after 3 weeks. The pain resolved. Then it returned 3 weeks , lasting for 1 week. Currently pt has no pain. Pt goes to the gym 25 min cardio on treadmill, weight machines, sometimes sit-up / crunches. Pt avoids lifting heavy stuff because he was doing deadlifts with a trainer but later had a inguinal hernia L. Inguinal urgery 2016. Pt worked with the trainer for 2 years and was performiong sits and crunches. Pt has a sedentary job. Denied burning urination currently but it occurred when he had prostatitis Sx. denied LBP.         [facility] PT Assessment - 03/07/19 1323      Assessment   Medical Diagnosis  chronic prostatitis    Referring Provider (PT)  Erlene Quan      Precautions   Precautions  None      Restrictions   Weight Bearing Restrictions  No      Balance Screen   Has the patient fallen in the past 6 months  No      Coordination   Gross Motor Movements are Fluid and Coordinated  --   diaphragm excursion present      Functional Tests   Functional tests  --   plan to assess fitness exercises technique and alignment      Palpation   Spinal mobility  R trunk rotation less than L     SI assessment   standing: L iliac crest higher, L shoulder lower, ( post Tx: levelled iliac crest, shoulder levelled)    Palpation comment  increased scar restrictions over umbilicus ( site for inguinal hernia), L SIJ hypermobile, restrictino FADDIR  ( improved post Tx)       Bed Mobility   Bed Mobility  --   head lift                Objective measurements completed on examination: See above findings.    Pelvic Floor Special Questions - 03/07/19 1346    Diastasis Recti  neg    External Perineal Exam  through clothing    External Palpation  tenderness/ minor tightness at ischiocavernosus B ( post Tx: decreased )        [facility] Adult PT Treatment/Exercise - 03/07/19 1323      Neuro Re-ed    Neuro Re-ed Details   cued for deep core, post  workout stretches, relaxation       Modalities   Modalities  Moist Heat      Moist Heat Therapy   Number Minutes Moist Heat  5 Minutes    Moist Heat Location  --   under sacrum during breathing/ relaxation     Manual Therapy   Manual therapy comments  L long axis distraction, PA mob, inferior mob at sacrum for anterior rotation of iliac crest, and promote SIJ mobility                   PT Long Term Goals - 03/07/19 1808      PT LONG TERM GOAL #1   Title  Pt will demo equal pelvic girdle alignment across 2 sessions to progress to deep core strengthening and minimize relapse of Sx    Time  2    Period  Weeks    Status  New    Target Date  03/21/19      PT LONG TERM GOAL #2   Title  Pt will demo proper alignment and technique with fitness and weight lifting in order to minimize overactivity of pelvic floor mm    Time  10    Period  Weeks    Status  New    Target Date  05/16/19      PT LONG TERM GOAL #3   Title  Pt will demoproper body mechanics to minmize straining  pelvic floor and abdominal mm to promote intraabdominal pressure and minimize pelvic Sx and minimize risk for hernias.    Time  5    Period  Weeks    Status  New    Target Date  04/11/19             Plan - 03/07/19 1804    Clinical Impression Statement  Pt is a 33 yo male who reports of experiencing dull/ sharp  lower abdominal pain L in March 2020. Pt was put on medication which helped after 3 weeks. The pain resolved. Then it returned 3 weeks , lasting for 1 week. Currently pt has no pain and no burning with urination.   Clinical presentations include pelvic obliquities, restricted hip mobility, poor posture and movements that strain abdominopelvic area, and increased tightness at pelvic floor mm. Following Tx today, pt demo'd increased L SIJ mobility and equal alignment of pelvic girdle.  Contributing factors: Pt goes to the gym 25 min cardio on treadmill, weight machines, sometimes sit-up / crunches, without a flexibility routine. Pt avoids lifting heavy stuff because he was doing deadlifts with a trainer but later had a inguinal hernia L. Inguinal urgery 2016. Pt worked with the trainer for 2 years and was performiong sits and crunches. Pt has a sedentary job. Denied burning urination currently.   Plan to assess alignment and technique with fitness exercises to minimize overactivity of pelvic floor mm and minimize risk for hernias. Pt will benefit from skilled PT.     Personal Factors and Comorbidities  Fitness    Stability/Clinical Decision Making  Stable/Uncomplicated    Clinical Decision Making  Low    Rehab Potential  Good    PT Frequency  1x / week    PT Duration  --   10   PT Treatment/Interventions  Neuromuscular re-education;Manual techniques;Manual lymph drainage;Therapeutic activities;Functional mobility training;Gait training;Moist Heat;Therapeutic exercise;Cryotherapy;Scar mobilization;Balance training    Consulted and Agree with Plan of Care  Patient       Patient  will benefit from skilled therapeutic  intervention in order to improve the following deficits and impairments:  Decreased scar mobility, Increased muscle spasms, Decreased range of motion, Decreased coordination, Decreased mobility, Improper body mechanics, Hypomobility  Visit Diagnosis: Sacrococcygeal disorders, not elsewhere classified  Other lack of coordination  Other muscle spasm     Problem List There are no active problems to display for this patient.   Mariane Masters ,PT, DPT, E-RYT  03/07/2019, 6:11 PM  [place] [facility] MAIN [facility] SERVICES [address] [place], [place], [address] Phone: [phone]   Fax:  [phone]  Name: Sebastiano Volkov MRN: [id number] Date of Birth: 1986/04/15

## 2019-03-07 NOTE — Patient Instructions (Addendum)
Post workout stretches:   1) Figure 4   2) Cross thigh over thigh     3) Side of hip stretch:  Reclined twist for hips and side of the hips/ legs  Lay on your back, knees bend Scoot hips to the R , leave shoulders in place Drop knees to the L side resting onto pillows to keep leg at the same width of hips Pillow under L thigh to minimize too much strain   4) paced breathing , relaxation 5 min  ( also do after work to [employer])    _____   Avoid straining pelvic floor, abdominal muscles , spine  Use log rolling technique instead of getting out of bed with your neck or the sit-up     Log rolling into and out of bed   Log rolling into and out of bed If getting out of bed on R side, Bent knees, scoot hips/ shoulder to L  Raise R arm completely overhead, rolling onto armpit  Then lower bent knees to bed to get into complete side lying position  Then drop legs off bed, and push up onto R elbow/forearm, and use L hand to push onto the bed

## 2019-03-14 ENCOUNTER — Other Ambulatory Visit: Payer: Self-pay

## 2019-03-14 ENCOUNTER — Ambulatory Visit: Payer: 59 | Admitting: Physical Therapy

## 2019-03-14 DIAGNOSIS — R278 Other lack of coordination: Secondary | ICD-10-CM

## 2019-03-14 DIAGNOSIS — M533 Sacrococcygeal disorders, not elsewhere classified: Secondary | ICD-10-CM

## 2019-03-14 DIAGNOSIS — M62838 Other muscle spasm: Secondary | ICD-10-CM

## 2019-03-14 NOTE — Therapy (Signed)
[place] MAIN [facility] SERVICES [address] [place], [place], [address] Phone: [phone]   Fax:  [phone]  Physical Therapy Treatment  Patient Details  Name: Gordon Chang MRN: [id number] Date of Birth: Apr 21, 1986 Referring Provider (PT): Erlene Quan   Encounter Date: 03/14/2019  PT End of Session - 03/14/19 1534    Visit Number  2    Number of Visits  10    Date for PT Re-Evaluation  05/16/19    PT Start Time  1300    PT Stop Time  1400    PT Time Calculation (min)  60 min    Activity Tolerance  Patient tolerated treatment well;No increased pain    Behavior During Therapy  WFL for tasks assessed/performed       Past Medical History:  Diagnosis Date  . Asthma   . Kidney stone     Past Surgical History:  Procedure Laterality Date  . HERNIA REPAIR      There were no vitals filed for this visit.  Subjective Assessment - 03/14/19 1304    Subjective  Pt went to the gym once last week and did not do the sit up machine. Pt used the treadmill, chest press, biceps machine.         [facility] PT Assessment - 03/14/19 1307      PROM   Overall PROM Comments  supine hip 90-90:  R 20 deg, L 40 deg, (post Tx: B 40 deg )       Palpation   SI assessment   iliac crest levelled, L shoulder still lower     Palpation comment  increased slight R deviation at C6-7, interspinal and cervical mm tightness , levator R , L occiputal mm  ( decreased post Tx)                Pelvic Floor Special Questions - 03/14/19 1316    External Perineal Exam  thru clothing     External Palpation  less tenderness compared to last week, tightness L > R at ischio/ bulbospongiosus ( Post Tx: decreased tightness)          [facility] Adult PT Treatment/Exercise - 03/14/19 1352      Therapeutic Activites    Therapeutic Activities  --    relaxation practice/ cool down to offset workstress   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), 3 screen ( works)       Neuro Re-ed    Neuro Re-ed Details   relaxation strategies ( body scan, explained the role of relaxation to decrease mm tightness)       Modalities   Modalities  Moist Heat      Moist Heat Therapy   Number Minutes Moist Heat  5 Minutes    Moist Heat Location  --   cervical ( guided relaxation)      Manual Therapy   Manual therapy comments  STM/MWM at problem areas noted in assessment                    PT Long Term Goals - 03/07/19 1808      PT LONG TERM GOAL #1   Title  Pt will demo equal pelvic girdle alignment across 2 sessions to progress to deep core strengthening and minimize relapse of Sx    Time  2    Period  Weeks    Status  New  Target Date  03/21/19      PT LONG TERM GOAL #2   Title  Pt will demo proper alignment and technique with fitness and weight lifting in order to minimize overactivity of pelvic floor mm    Time  10    Period  Weeks    Status  New    Target Date  05/16/19      PT LONG TERM GOAL #3   Title  Pt will demoproper body mechanics to minmize straining pelvic floor and abdominal mm to promote intraabdominal pressure and minimize pelvic Sx and minimize risk for hernias.    Time  5    Period  Weeks    Status  New    Target Date  04/11/19            Plan - 03/14/19 1536    Clinical Impression Statement  Pt demo'd decreased tightness on R pelvic floor and cervical mm along with increased hip mobility after today's manual Tx. R shoulder height appeared slightly lowered post Tx. Suspect pt's computer monitor set up at home and work play a factor with R sided tightness. Pt was educated on ensuring more whole body movement in chair at work and home station to minimize overuse of R neck muscles. Added relaxation strategies to apply after work as pt reported work stress and to apply after flexibility routine after working out at [employer] gym in order to minimize overactivity of body tensions.   Plan to progress to deep core strengthening at next session. Pt continues to benefit from skilled PT.    Personal Factors and Comorbidities  Fitness    Stability/Clinical Decision Making  Stable/Uncomplicated    Rehab Potential  Good    PT Frequency  1x / week    PT Duration  --   10   PT Treatment/Interventions  Neuromuscular re-education;Manual techniques;Manual lymph drainage;Therapeutic activities;Functional mobility training;Gait training;Moist Heat;Therapeutic exercise;Cryotherapy;Scar mobilization;Balance training    Consulted and Agree with Plan of Care  Patient       Patient will benefit from skilled therapeutic intervention in order to improve the following deficits and impairments:  Decreased scar mobility, Increased muscle spasms, Decreased range of motion, Decreased coordination, Decreased mobility, Improper body mechanics, Hypomobility  Visit Diagnosis: Sacrococcygeal disorders, not elsewhere classified  Other lack of coordination  Other muscle spasm     Problem List There are no active problems to display for this patient.   [name],Shin Yiing ,PT, DPT, E-RYT  03/14/2019, 3:43 PM  [place] [facility] MAIN [facility] [address] [place], [place], [address] Phone: [phone]   Fax:  [phone]  Name: Gordon DurandBrian Chang MRN: [id number] Date of Birth: [birth year]

## 2019-03-14 NOTE — Patient Instructions (Signed)
discussed changing computer setup to avoid neck turns between  2 screens ( at home)    3 screens ( works) move feet, hip, trunk and neck with the rolly chair to face each screen     ___  Destress and relax body   Body scan   Prop legs up  10 min

## 2019-03-21 ENCOUNTER — Ambulatory Visit: Payer: 59 | Admitting: Physical Therapy

## 2019-03-21 ENCOUNTER — Other Ambulatory Visit: Payer: Self-pay

## 2019-03-21 DIAGNOSIS — M62838 Other muscle spasm: Secondary | ICD-10-CM

## 2019-03-21 DIAGNOSIS — M533 Sacrococcygeal disorders, not elsewhere classified: Secondary | ICD-10-CM

## 2019-03-21 DIAGNOSIS — R278 Other lack of coordination: Secondary | ICD-10-CM

## 2019-03-21 NOTE — Therapy (Signed)
[place] MAIN [facility] SERVICES [address] [place], [place], [address] Phone: [phone]   Fax:  [phone]  Physical Therapy Treatment  Patient Details  Name: Gordon Chang MRN: [id number] Date of Birth: 08-Dec-1985 Referring Provider (PT): Erlene Quan   Encounter Date: 03/21/2019  PT End of Session - 03/21/19 6269    Visit Number  3    Number of Visits  10    Date for PT Re-Evaluation  05/16/19    PT Start Time  4854    PT Stop Time  1400    PT Time Calculation (min)  55 min    Activity Tolerance  Patient tolerated treatment well;No increased pain    Behavior During Therapy  WFL for tasks assessed/performed       Past Medical History:  Diagnosis Date  . Asthma   . Kidney stone     Past Surgical History:  Procedure Laterality Date  . HERNIA REPAIR      There were no vitals filed for this visit.  Subjective Assessment - 03/21/19 1308    Subjective  Pt took a picture of his work station. Pt felt ok after last session         [facility] PT Assessment - 03/21/19 1346      Coordination   Gross Motor Movements are Fluid and Coordinated  --   dyscoordinatino of deep core      Other:   Other/ Comments  simulated weight machine pushing/ pulling: poor lower kinetic chain co-activation, breathholding on exertion ( post TX: improved)       Palpation   Palpation comment  no deviation at C6-7, slight deviation at C2, tightenss at occiptal mm R                 Pelvic Floor Special Questions - 03/21/19 1358    External Perineal Exam  thru clothing     External Palpation  no tenderness at pelvic floor         [facility] Adult PT Treatment/Exercise - 03/21/19 1347      Therapeutic Activites    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  biofeedback with tactile cues       Manual Therapy   Manual therapy comments  distraction at occiput, MWM/STM at C2  C2, occiptal mm R                   PT Long Term Goals - 03/07/19 1808      PT LONG TERM GOAL #1   Title  Pt will demo equal pelvic girdle alignment across 2 sessions to progress to deep core strengthening and minimize relapse of Sx    Time  2    Period  Weeks    Status  New    Target Date  03/21/19      PT LONG TERM GOAL #2   Title  Pt will demo proper alignment and technique with fitness and weight lifting in order to minimize overactivity of pelvic floor mm    Time  10    Period  Weeks    Status  New    Target Date  05/16/19      PT LONG TERM GOAL #3   Title  Pt will demoproper body mechanics to minmize  straining pelvic floor and abdominal mm to promote intraabdominal pressure and minimize pelvic Sx and minimize risk for hernias.    Time  5    Period  Weeks    Status  New    Target Date  04/11/19            Plan - 03/21/19 1403    Clinical Impression Statement  Pt demo'd signficantly decreased pelvic floor mm tightness and spinal mm tightness on R since last session. Progressed to deep core coordination to optimize pelvic floor mobility and  correct for breathholding/ breatholding against exertion in simulated weight machine tasks. Pt required tactile and visual cues to correct coordination technique with weight machines training. Pt continues to benefit from skilled PT.    Personal Factors and Comorbidities  Fitness    Stability/Clinical Decision Making  Stable/Uncomplicated    Rehab Potential  Good    PT Frequency  1x / week    PT Duration  --   10   PT Treatment/Interventions  Neuromuscular re-education;Manual techniques;Manual lymph drainage;Therapeutic activities;Functional mobility training;Gait training;Moist Heat;Therapeutic exercise;Cryotherapy;Scar mobilization;Balance training    Consulted and Agree with Plan of Care  Patient        Patient will benefit from skilled therapeutic intervention in order to improve the following deficits and impairments:  Decreased scar mobility, Increased muscle spasms, Decreased range of motion, Decreased coordination, Decreased mobility, Improper body mechanics, Hypomobility  Visit Diagnosis: Sacrococcygeal disorders, not elsewhere classified  Other lack of coordination  Other muscle spasm     Problem List There are no active problems to display for this patient.   Gordon Chang,Shin Yiing ,PT, DPT, E-RYT  03/21/2019, 2:12 PM  [place] [facility] MAIN [facility] [address] [place], [place], [address] Phone: [phone]   Fax:  [phone]  Name: Gordon DurandBrian Chang MRN: [id number] Date of Birth: April 05, 1986

## 2019-03-28 ENCOUNTER — Other Ambulatory Visit: Payer: Self-pay

## 2019-03-28 ENCOUNTER — Ambulatory Visit: Payer: 59 | Admitting: Physical Therapy

## 2019-03-28 DIAGNOSIS — M533 Sacrococcygeal disorders, not elsewhere classified: Secondary | ICD-10-CM | POA: Diagnosis not present

## 2019-03-28 DIAGNOSIS — R278 Other lack of coordination: Secondary | ICD-10-CM

## 2019-03-28 DIAGNOSIS — M62838 Other muscle spasm: Secondary | ICD-10-CM

## 2019-03-28 NOTE — Patient Instructions (Addendum)
Green band at door knob:'  Facing the door  Minisquat to start Exhale with chest rows Stand up 20 reps  Back to the door Hulk hugan,  Hands at the level of the waist  10reps     Green band at the bottom of the door  Facing the door : Triceps Ski track stance 20 reps   Back to the door: Biceps Ski track stance 20 reps     Green band at the top of the door  Back to to the door  Minisquat: Lat pull down: elbow down from "w" position 20reps    Heel raises 20 reps x 3 rep   ___   Modifications to leg lift  Single leg, on exhale to 30 degs up, no arched back   Standing bicycle crunches instead of lying down

## 2019-03-28 NOTE — Therapy (Signed)
[place] [facility] MAIN [facility] SERVICES [address] [place], [place], [address] Phone: [phone]   Fax:  [phone]  Physical Therapy Treatment  Patient Details  Name: Chalmers Iddings MRN: [id number] Date of Birth: [birth year] Referring Provider (PT): Apolinar Junes   Encounter Date: 03/28/2019  PT End of Session - 03/28/19 1400    Visit Number  4    Number of Visits  10    Date for PT Re-Evaluation  05/16/19    PT Start Time  1300    PT Stop Time  1403    PT Time Calculation (min)  63 min    Activity Tolerance  Patient tolerated treatment well;No increased pain    Behavior During Therapy  WFL for tasks assessed/performed       Past Medical History:  Diagnosis Date  . Asthma   . Kidney stone     Past Surgical History:  Procedure Laterality Date  . HERNIA REPAIR      There were no vitals filed for this visit.  Subjective Assessment - 03/28/19 1310    Subjective  Pt had no problems with the HEP         [facility] PT Assessment - 03/28/19 1346      Observation/Other Assessments   Observations  levelled shoulders       Strength   Overall Strength Comments  PF with single UE required 18 reps on R,   Grade 5/5                     [facility] Adult PT Treatment/Exercise - 03/28/19 1343      Therapeutic Activites    Work Simulation  explained principles to core exercises to not strain pelvic / ab muscles. annd changing up seated weight machines to standing with cable columns       Neuro Re-ed    Neuro Re-ed Details   cued for alignment techniques in lower kinetic chain and in upper quadrant with and exercises                   PT Long Term Goals - 03/28/19 1312      PT LONG TERM GOAL #1   Title  Pt will demo equal pelvic girdle alignment across 2 sessions to progress to deep core strengthening and minimize relapse of Sx    Time  2    Period  Weeks    Status  Achieved      PT LONG TERM GOAL #2   Title  Pt will demo  proper alignment and technique with fitness and weight lifting in order to minimize overactivity of pelvic floor mm    Time  10    Period  Weeks    Status  Achieved      PT LONG TERM GOAL #3   Title  Pt will demoproper body mechanics to minmize straining pelvic floor and abdominal mm to promote intraabdominal pressure and minimize pelvic Sx and minimize risk for hernias.    Time  5    Period  Weeks    Status  Achieved            Plan - 03/28/19 1403    Clinical Impression Statement  Pt demo'd equal shoulder height and significantly decreased mm tightness at previous problem areas. Progressed to fitness and core exercise education and demo'd modifications to minimize relapse of Sx. Pt demo'd technique properly after cues to minimize excessive lumbar lordosis and to promote transverse arch  co-activation which will help with decreasing overuse of pelvic floor mm. Pt continues to benefit from skilled PT    Personal Factors and Comorbidities  Fitness    Stability/Clinical Decision Making  Stable/Uncomplicated    Rehab Potential  Good    PT Frequency  1x / week    PT Duration  --   10   PT Treatment/Interventions  Neuromuscular re-education;Manual techniques;Manual lymph drainage;Therapeutic activities;Functional mobility training;Gait training;Moist Heat;Therapeutic exercise;Cryotherapy;Scar mobilization;Balance training    Consulted and Agree with Plan of Care  Patient       Patient will benefit from skilled therapeutic intervention in order to improve the following deficits and impairments:  Decreased scar mobility, Increased muscle spasms, Decreased range of motion, Decreased coordination, Decreased mobility, Improper body mechanics, Hypomobility  Visit Diagnosis: Other lack of coordination  Other muscle spasm     Problem List There are no active problems to display for this patient.   Jerl Mina 03/28/2019, 2:04 PM  [place]  MAIN [facility] SERVICES [address] [place], [place], [address] Phone: [phone]   Fax:  [phone]  Name: Ladarion Munyon MRN: [id number] Date of Birth: [birth year]

## 2019-04-04 ENCOUNTER — Ambulatory Visit: Payer: 59 | Admitting: Physical Therapy

## 2019-04-11 ENCOUNTER — Ambulatory Visit: Payer: 59 | Attending: Urology | Admitting: Physical Therapy

## 2019-04-11 ENCOUNTER — Other Ambulatory Visit: Payer: Self-pay

## 2019-04-11 DIAGNOSIS — M62838 Other muscle spasm: Secondary | ICD-10-CM

## 2019-04-11 DIAGNOSIS — R278 Other lack of coordination: Secondary | ICD-10-CM

## 2019-04-11 DIAGNOSIS — M533 Sacrococcygeal disorders, not elsewhere classified: Secondary | ICD-10-CM | POA: Diagnosis present

## 2019-04-12 NOTE — Therapy (Addendum)
[place] [facility] MAIN [facility] SERVICES [address] [place], [place], [address] Phone: [phone]   Fax:  [phone]  Physical Therapy Treatment / Discharge Summary   Patient Details  Name: Gordon Chang MRN: [id number] Date of Birth: [birth year] Referring Provider (PT): Apolinar Junes   Encounter Date: 04/11/2019  PT End of Session - 04/12/19 0948    Visit Number  4    Number of Visits  10    Date for PT Re-Evaluation  05/16/19    PT Start Time  1303    PT Stop Time  1345    PT Time Calculation (min)  42 min    Activity Tolerance  Patient tolerated treatment well;No increased pain    Behavior During Therapy  WFL for tasks assessed/performed       Past Medical History:  Diagnosis Date  . Asthma   . Kidney stone     Past Surgical History:  Procedure Laterality Date  . HERNIA REPAIR      There were no vitals filed for this visit.  Subjective Assessment - 04/11/19 1309    Subjective  pt got a monitor riser to use for work at home         [facility] PT Assessment - 04/12/19 0957      Other:   Other/ Comments  less lumbar lordosis, poor dissassociation between trunk and lower extremeties            Treatment:   Cable collumn  PNF patterns at cable column 10 reps  Lat pull down 10 reps  Glut ext 10 reps  Pulling , walking forward and backward  10 reps                        PT Long Term Goals - 04/11/19 1308      PT LONG TERM GOAL #1   Title  Pt will demo equal pelvic girdle alignment across 2 sessions to progress to deep core strengthening and minimize relapse of Sx    Time  2    Period  Weeks    Status  Achieved      PT LONG TERM GOAL #2   Title  Pt will demo proper alignment and technique with fitness and weight lifting in order to minimize overactivity of pelvic floor mm    Time  10    Period  Weeks    Status  Achieved      PT LONG TERM GOAL #3   Title  Pt will demoproper body mechanics to minmize  straining pelvic floor and abdominal mm to promote intraabdominal pressure and minimize pelvic Sx and minimize risk for hernias.    Time  5    Period  Weeks    Status  Achieved            Plan - 04/11/19 1310    Clinical Impression Statement Pt has achieved 100% of his goals. Pt has maintained equal pelvic girdle alignment after manual Tx corrected SIJ malalignment. Pt also no longer shows uneven shoulder height, forward head posture, , significantly decreased back / hip mm tightness, increased deep core strength, and proper technique with fitness routine with weight/ resistance strengthening. Pt has been practicing modifications and alternatives to sit-ups . Pt also modified his work station at home and work to minimize tightness at R spinal mm and repeated movements which was associated with his previous shoulder height/ back mm tightness asymmetries. These are ways to minimize  relapse of his Sx. Pt is IND with stretches .   Pt reports he has improved "A Very Great Better" . Pt is ready for d/c at this time. Pt has remained compliant and motivated throughout his POC.    Personal Factors and Comorbidities  Fitness    Stability/Clinical Decision Making  Stable/Uncomplicated    Rehab Potential  Good    PT Frequency  1x / week    PT Duration  --   10   PT Treatment/Interventions  Neuromuscular re-education;Manual techniques;Manual lymph drainage;Therapeutic activities;Functional mobility training;Gait training;Moist Heat;Therapeutic exercise;Cryotherapy;Scar mobilization;Balance training    Consulted and Agree with Plan of Care  Patient       Patient will benefit from skilled therapeutic intervention in order to improve the following deficits and impairments:  Decreased scar mobility, Increased muscle spasms, Decreased range of motion, Decreased coordination, Decreased mobility, Improper body mechanics, Hypomobility  Visit Diagnosis: Other lack of coordination  Other muscle  spasm  Sacrococcygeal disorders, not elsewhere classified     Problem List There are no active problems to display for this patient.   Jerl Mina ,PT, DPT, E-RYT  04/12/2019, 10:04 AM  [place] MAIN [facility] SERVICES [address] [place], [place], [address] Phone: [phone]   Fax:  [phone]  Name: Gordon Chang MRN: [id number] Date of Birth: 10-28-1985

## 2019-04-18 ENCOUNTER — Ambulatory Visit: Payer: 59 | Admitting: Physical Therapy

## 2019-04-25 ENCOUNTER — Ambulatory Visit: Payer: 59 | Admitting: Physical Therapy

## 2019-05-02 ENCOUNTER — Ambulatory Visit: Payer: 59 | Admitting: Physical Therapy

## 2019-05-17 IMAGING — US US ART/VEN ABD/PELV/SCROTUM DOPPLER LTD
1 series · 14 of 25 positions shown · non-contrast
Comparison: Pelvic ultrasound 01/27/2017

CLINICAL DATA: Scrotal pain

EXAM:
SCROTAL ULTRASOUND
DOPPLER ULTRASOUND OF THE TESTICLES
TECHNIQUE: Complete ultrasound examination of the testicles, epididymis, and
other scrotal structures was performed. Color and spectral Doppler
ultrasound were also utilized to evaluate blood flow to the
testicles.

[Series 1: us art/ven abd/pelv/scrotum doppler ltd · 14 of 73 slices shown]
[im 1/73]
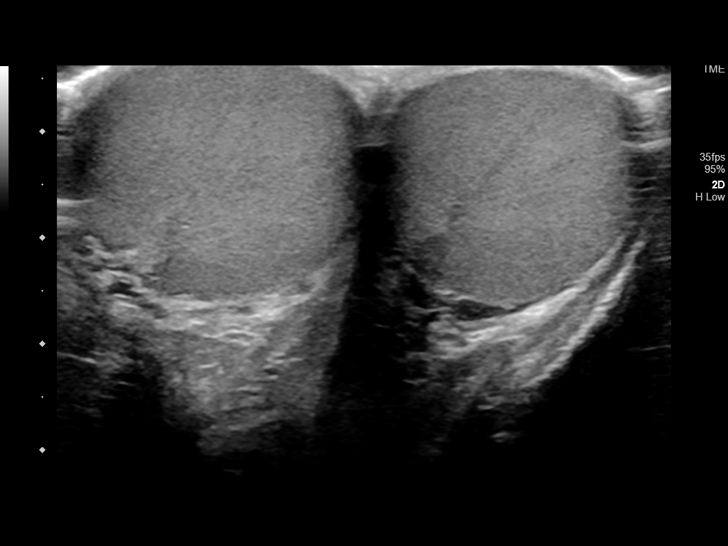
[im 7/73]
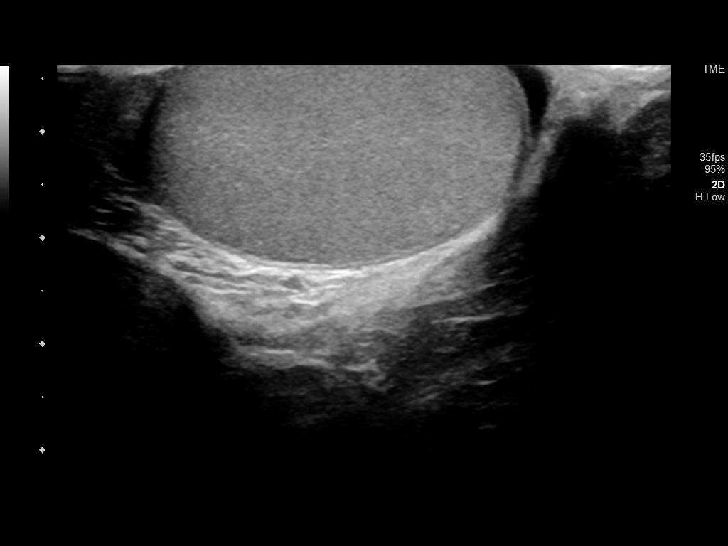
[im 13/73]
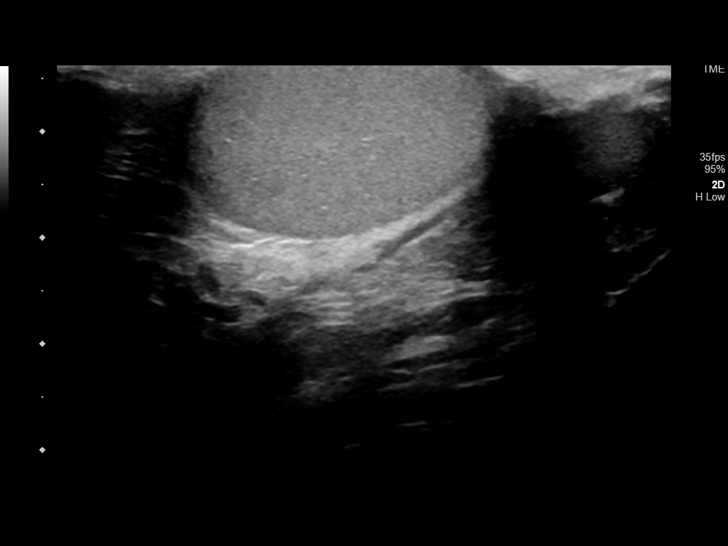
[im 19/73]
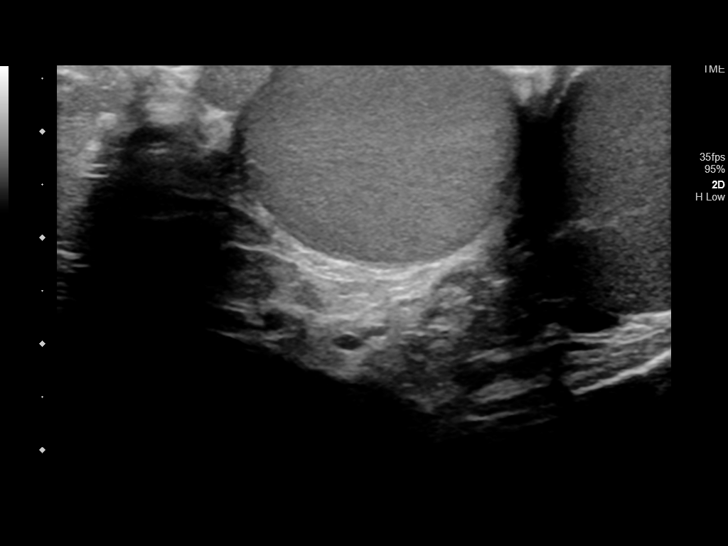
[im 25/73]
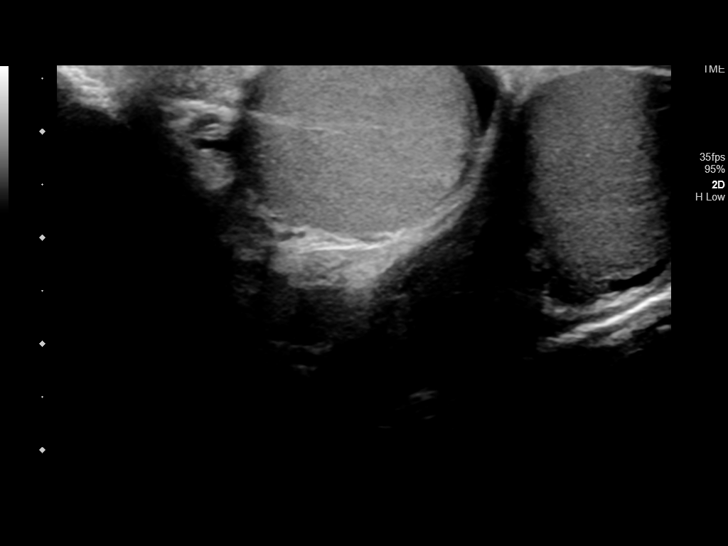
[im 28/73]
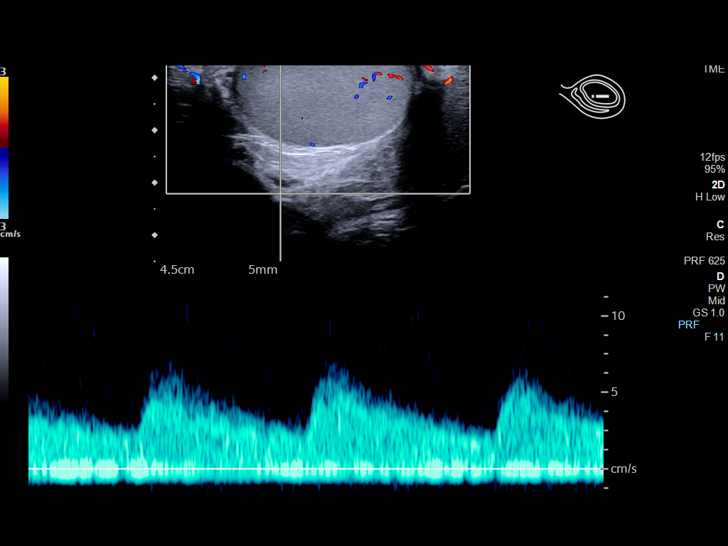
[im 34/73]
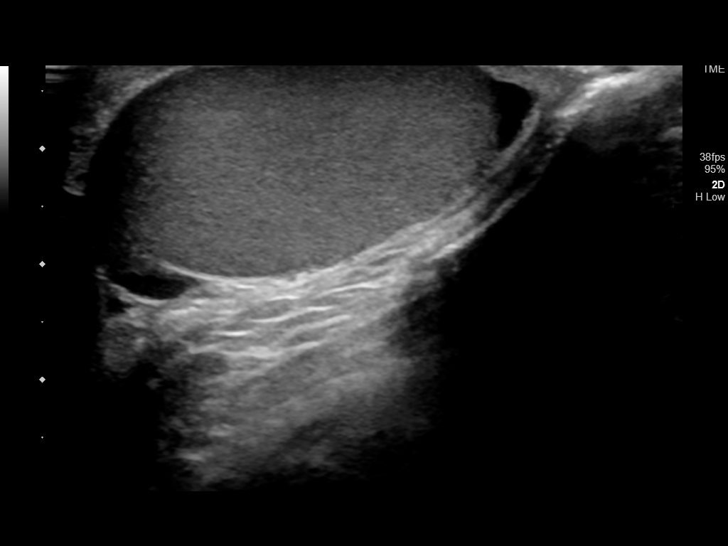
[im 40/73]
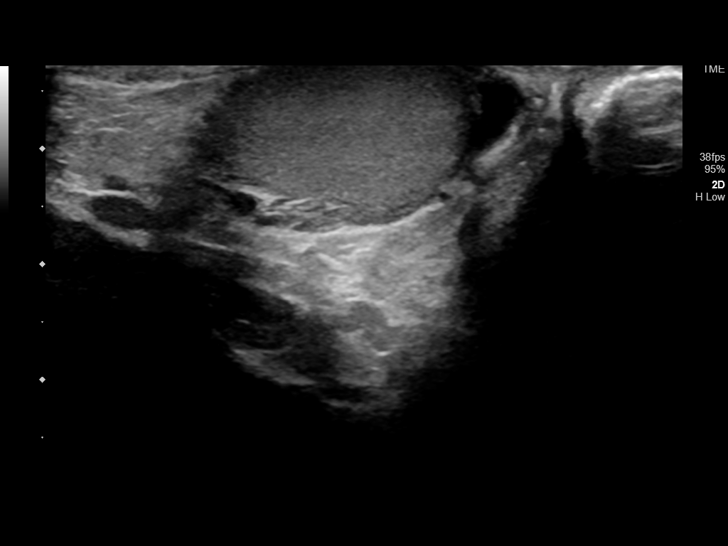
[im 46/73]
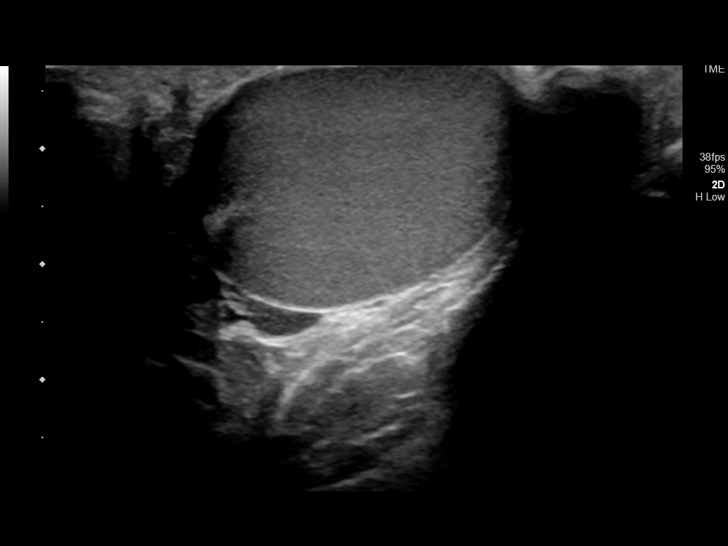
[im 49/73]
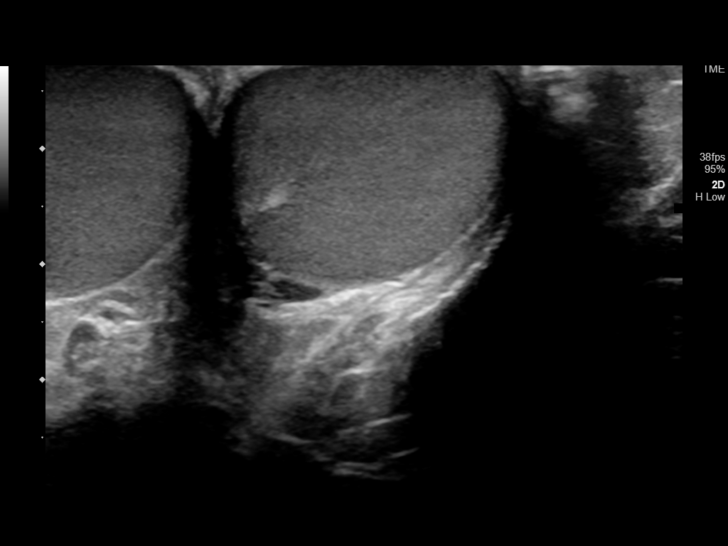
[im 55/73]
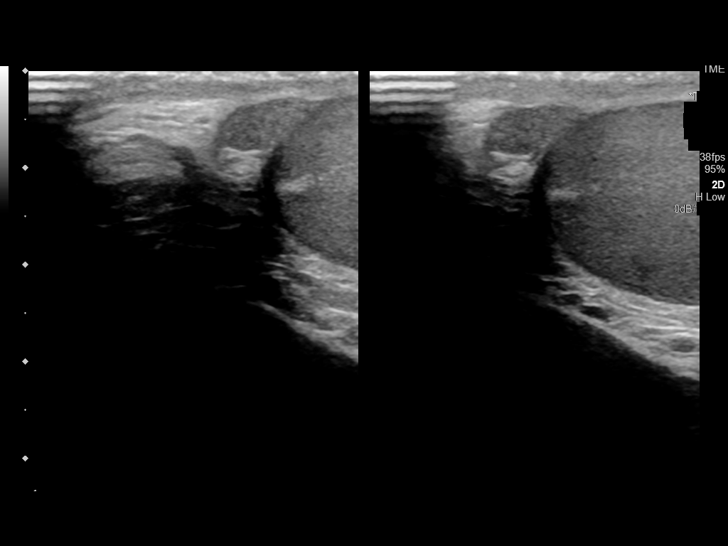
[im 61/73]
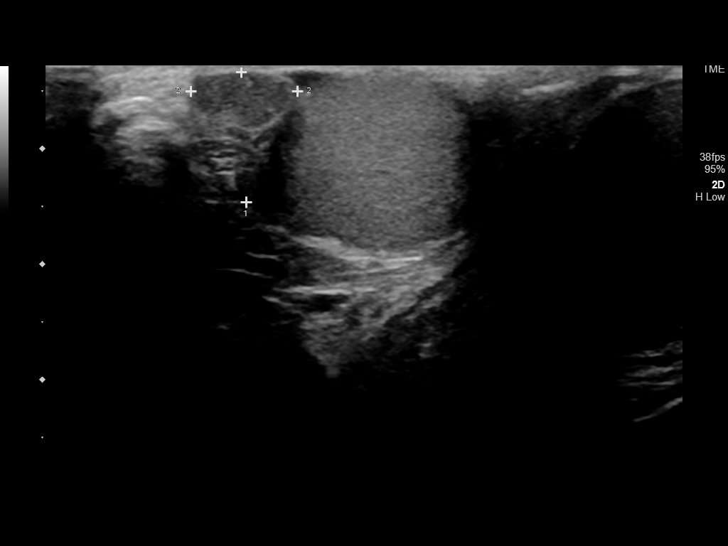
[im 67/73]
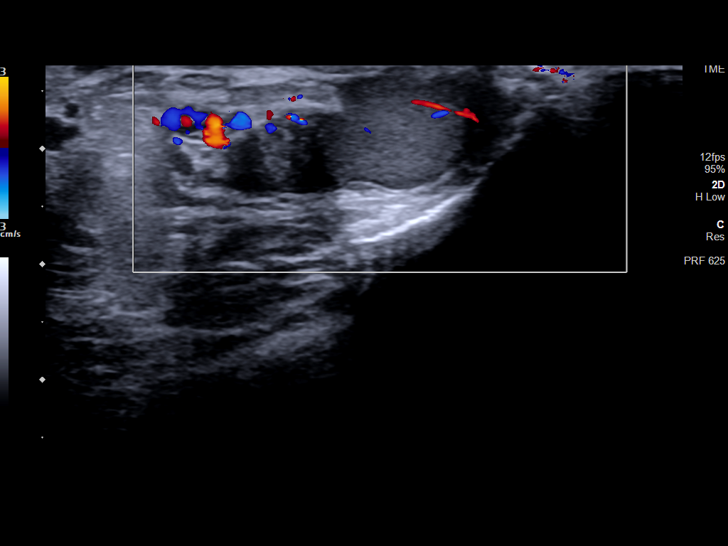
[im 73/73]
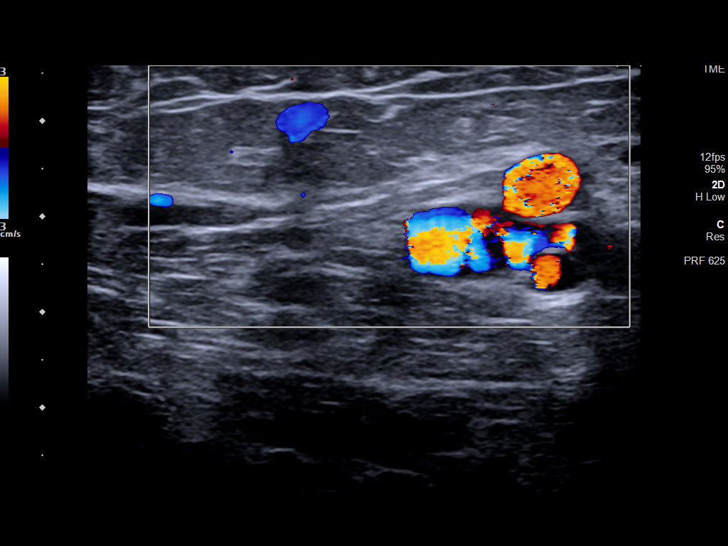

[14 of 25 positions shown; findings below may reference images not displayed]

FINDINGS: Right testicle

Measurements: 3.5 x 2.1 x 2.6 cm. No mass or microlithiasis
visualized.

Left testicle

Measurements: 3.4 x 1.8 x 2.4 cm. No mass or microlithiasis
visualized.

Right epididymis:  Normal in size and appearance.

Left epididymis:  Normal in size and appearance.

Hydrocele:  None visualized.

Varicocele:  None visualized.

Pulsed Doppler interrogation of both testes demonstrates normal low
resistance arterial and venous waveforms bilaterally.
IMPRESSION: Normal testicular ultrasound.

## 2019-11-22 ENCOUNTER — Ambulatory Visit: Payer: Self-pay | Attending: Internal Medicine

## 2019-11-22 ENCOUNTER — Other Ambulatory Visit: Payer: Self-pay

## 2019-11-22 DIAGNOSIS — Z23 Encounter for immunization: Secondary | ICD-10-CM

## 2019-11-22 NOTE — Progress Notes (Signed)
   Covid-19 Vaccination Clinic  Name:  Gavriel Holzhauer    MRN: [id number] DOB: August 22, 1985  11/22/2019  Mr. Langhans was observed post Covid-19 immunization for 15 minutes without incident. He was provided with Vaccine Information Sheet and instruction to access the V-Safe system.   Mr. Overturf was instructed to call 911 with any severe reactions post vaccine: Marland Kitchen Difficulty breathing  . Swelling of face and throat  . A fast heartbeat  . A bad rash all over body  . Dizziness and weakness   Immunizations Administered    Name Date Dose VIS Date Route   Pfizer COVID-19 Vaccine 11/22/2019  8:55 AM 0.3 mL 08/28/2018 Intramuscular   Manufacturer: [employer], [employer]   Lot: [id number]   NDC: [address]-9

## 2019-12-06 ENCOUNTER — Other Ambulatory Visit: Payer: Self-pay

## 2019-12-06 ENCOUNTER — Telehealth: Payer: Self-pay | Admitting: Urology

## 2019-12-06 DIAGNOSIS — N411 Chronic prostatitis: Secondary | ICD-10-CM

## 2019-12-06 NOTE — Telephone Encounter (Signed)
Yes please place referral

## 2019-12-06 NOTE — Telephone Encounter (Signed)
Pt call asking for an order to be sent to Physical therapy, states he did PT last yr and it seemed to help him, wanted to have more therapy appts however PT told pt he would need another order sent.  Please advise.

## 2019-12-06 NOTE — Telephone Encounter (Signed)
Order is in.

## 2019-12-14 ENCOUNTER — Ambulatory Visit: Payer: Self-pay | Attending: Oncology

## 2019-12-14 ENCOUNTER — Other Ambulatory Visit: Payer: Self-pay

## 2019-12-14 DIAGNOSIS — Z23 Encounter for immunization: Secondary | ICD-10-CM

## 2019-12-14 NOTE — Progress Notes (Signed)
   Covid-19 Vaccination Clinic  Name:  Gordon Chang    MRN: [id number] DOB: [birth year]  12/14/2019  [name] was observed post Covid-19 immunization for 15 minutes without incident. He was provided with Vaccine Information Sheet and instruction to access the V-Safe system.   [name] was instructed to call 911 with any severe reactions post vaccine: Marland Kitchen Difficulty breathing  . Swelling of face and throat  . A fast heartbeat  . A bad rash all over body  . Dizziness and weakness   Immunizations Administered    Name Date Dose VIS Date Route   Pfizer COVID-19 Vaccine 12/14/2019  9:00 AM 0.3 mL 08/28/2018 Intramuscular   Manufacturer: [employer], [employer]   Lot: [id number]   NDC: [address]-5

## 2020-01-02 ENCOUNTER — Encounter: Payer: Self-pay | Admitting: Physical Therapy

## 2020-01-02 ENCOUNTER — Ambulatory Visit: Payer: BC Managed Care – PPO | Attending: Urology | Admitting: Physical Therapy

## 2020-01-02 ENCOUNTER — Other Ambulatory Visit: Payer: Self-pay

## 2020-01-02 DIAGNOSIS — R278 Other lack of coordination: Secondary | ICD-10-CM | POA: Diagnosis not present

## 2020-01-02 DIAGNOSIS — M533 Sacrococcygeal disorders, not elsewhere classified: Secondary | ICD-10-CM

## 2020-01-02 DIAGNOSIS — M62838 Other muscle spasm: Secondary | ICD-10-CM | POA: Diagnosis present

## 2020-01-02 NOTE — Patient Instructions (Addendum)
Squats with no arched back Thumb on ribs/ top of iliac crest to align on same plane.    Standing ab workout video to focus on:  -lateral lunge: do not lean knee too far out, more weight on ballmounds -reverse twist: bend knee softly, lean shoulders more forward,  weight on ballmounds, then twist  -pivot on ballmound on exercise #5

## 2020-01-03 NOTE — Therapy (Signed)
[place] [facility] MAIN [facility] SERVICES [address] [place], [place], [address] Phone: [phone]   Fax:  [phone]  Physical Therapy Evaluation  Patient Details  Name: Gordon Chang MRN: [id number] Date of Birth: 1985-09-06 Referring Provider (PT): Apolinar Junes    Encounter Date: 01/02/2020   PT End of Session - 01/03/20 1053    Visit Number 1    Number of Visits 4    Date for PT Re-Evaluation 01/31/20    PT Start Time 1600    PT Stop Time 1655    PT Time Calculation (min) 55 min           Past Medical History:  Diagnosis Date   Asthma    Kidney stone     Past Surgical History:  Procedure Laterality Date   HERNIA REPAIR      There were no vitals filed for this visit.    Subjective Assessment - 01/02/20 1601    Subjective Pt reported 3-4 weeks ago, pt was working out at [employer] and noticed the pelvic inflammation 5/10 the day after. Pt used Molicam and Tylenol which helped to bring the pain down to 0/10. The pain came on 2/10 the next day.  Pt kept up upright sitting. Pt used the elliptical that day which was a new machine he normally does not use. he inflammation last about a week inthe lower area. Current routine includes: chest, tricep, biceps, back on machines. Pt uses the techniques he learned from pelvic PT sessions and notices he is using his ab muscles with the weight machines and not longer uses the ab machines. Pt has no pain today. .    Patient Stated Goals maintain to have no more pelvic pain              [facility] PT Assessment - 01/03/20 1054      Assessment   Medical Diagnosis pelvic     Referring Provider (PT) Apolinar Junes       Precautions   Precautions None      Restrictions   Weight Bearing Restrictions No      Balance Screen   Has the patient fallen in the past 6 months No      Prior Function   Level of Independence Independent      Observation/Other Assessments   Observations upright posture , seated with  feet on floor       Functional Tests   Functional tests Other2      Other:   Other/Comments observed pt perform 5 min standing ab workout off Youtube and pt demo'd poor propioception of lower kinetic chain. observed pt on elliptical in gym and pt demo'd unsafe dismounting technique that puts pt at risk for knee/ ankle injury . pt required explaining to focus on more lower kinetic chain co-activation and less use of arms to power the elliptical movements       Strength   Overall Strength Comments posterior sling: R scaption weaker/ L glut strong,  L scaption/ R glut strong.   hip abd L 4-/5, R 3+/5   , hip ext 5/5        Palpation   Spinal mobility alignment present     SI assessment  R PSIS hypomobility                       Objective measurements completed on examination: See above findings.     Pelvic Floor Special Questions - 01/03/20 1056  External Perineal Exam through clothing, R pelvic floor tightness at anterior triangle             [facility] Adult PT Treatment/Exercise - 01/03/20 1056      Therapeutic Activites    Therapeutic Activities Other Therapeutic Activities    Other Therapeutic Activities requried cues for more lower kinetic chain chain co-activation in video for Standing Ab workout        Manual Therapy   Manual therapy comments PA mob at R PSIS to promote nutation of sacrum, STM/ MWM at R pelvic floor mm                      PT Short Term Goals - 01/03/20 1058      PT SHORT TERM GOAL #1   Title Pt will demo no pelvic floor mm tightness across 2 visits in order to minimize risk for pelvic pain and to continue working out at the gym    Time 2    Period Weeks    Status New    Target Date 01/17/20      PT SHORT TERM GOAL #2   Title Pt will demo proper lower kinetic chain alignment in his 5 min standing ab workout routine without cues in order to maintain pelvic girdle stability    Time 4    Period Weeks    Status New    Target  Date 01/31/20      PT SHORT TERM GOAL #3   Title Pt will demo no pelvic girdle hypomobility and malalignment in order to progress to fitness routine with less risk for relapse of pelvic pain    Time 2    Period Weeks    Status New    Target Date 01/17/20                     Plan - 01/03/20 1057    Clinical Impression Statement Pt is a 34 yo male who experienced pelvic pain after trying the elliptical machine in his workout a month ago. Pain subsided with medications and practicing HEP from his past Pelvic PT program. Pt does not have pain today but pt's assessment showed pelvic girdle malalignment, tightness of pelvic floor, hip weakness on R, poor propioception of lower kinetic chain in his self-selected workout routine. Following Tx today, pt demo'd increased SIJ mobility and increased R hip abduction strength, less hyperextension of knees/more lower kientic chain co-activation  in standing ab workout, and IND with using elliptical machine. Pt benefits from skilled PT to  Minimize risk for relapseo f pelvic pain.  .      Personal Factors and Comorbidities Fitness    Stability/Clinical Decision Making Evolving/Moderate complexity    Clinical Decision Making Moderate    Rehab Potential Good    PT Frequency 1x / week    PT Duration 4 weeks    PT Treatment/Interventions Neuromuscular re-education;Moist Heat;Therapeutic exercise;Balance training;Patient/family education;Manual techniques;Gait training    Consulted and Agree with Plan of Care Patient           Patient will benefit from skilled therapeutic intervention in order to improve the following deficits and impairments:  Hypermobility, Hypomobility, Decreased strength, Decreased mobility, Improper body mechanics, Postural dysfunction, Increased muscle spasms, Decreased coordination  Visit Diagnosis: Other lack of coordination  Other muscle spasm  Sacrococcygeal disorders, not elsewhere classified     Problem  List There are no problems to display for this patient.   Mariane Masters ,  PT, DPT, E-RYT  01/03/2020, 11:00 AM  [place] [facility] MAIN [facility] SERVICES [address] [place], [place], [address] Phone: [phone]   Fax:  [phone]  Name: Gordon Chang MRN: [id number] Date of Birth: [birth year]

## 2020-01-09 ENCOUNTER — Other Ambulatory Visit: Payer: Self-pay

## 2020-01-09 ENCOUNTER — Ambulatory Visit: Payer: BC Managed Care – PPO | Admitting: Physical Therapy

## 2020-01-09 DIAGNOSIS — R278 Other lack of coordination: Secondary | ICD-10-CM | POA: Diagnosis not present

## 2020-01-09 DIAGNOSIS — M62838 Other muscle spasm: Secondary | ICD-10-CM

## 2020-01-09 DIAGNOSIS — M533 Sacrococcygeal disorders, not elsewhere classified: Secondary | ICD-10-CM

## 2020-01-09 NOTE — Patient Instructions (Signed)
Stretching pelvic floor/hamstring , increasing hip mobility, and decreasing midback hunch    Mermaid   Both ankles to the R of R hip, L hand is on the floor by L side, rock 11 to 5 o clock from front L knee to back R buttocks  5 -10 reps x    Transition to half "V" sit  On top of pillow or folded blankets for anterior tilt of pelvis  Straighten out R knee, heel down, toe pointed up Both hands on the floor, chest lifts to decrease hunch back x 3  --> keep R hand on the floor, elbow bends, lift L arm up to arch over like a rainbow and then "hugging a ball". 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  

## 2020-01-09 NOTE — Therapy (Addendum)
[place] [facility] MAIN [facility] SERVICES [address] [place], [place], [address] Phone: [phone]   Fax:  [phone]  Physical Therapy Treatment / progress note   Patient Details  Name: Gordon Chang MRN: [id number] Date of Birth: [birth year] Referring Provider (PT): Apolinar Junes    Encounter Date: 01/09/2020   PT End of Session - 01/09/20 1653    Visit Number 2    Number of Visits 4    Date for PT Re-Evaluation 01/31/20    PT Start Time 1600    PT Stop Time 1654    PT Time Calculation (min) 54 min           Past Medical History:  Diagnosis Date  . Asthma   . Kidney stone     Past Surgical History:  Procedure Laterality Date  . HERNIA REPAIR      There were no vitals filed for this visit.   Subjective Assessment - 01/09/20 1606    Subjective Pt had no issues with the past exercises    Patient Stated Goals maintain to have no more pelvic pain              [facility] PT Assessment - 01/09/20 1645      Other:   Other/Comments plyometric landing with poor eccentric control, side plank alignment/ propioception poor       Palpation   SI assessment  no hypomobilty                      Pelvic Floor Special Questions - 01/09/20 1645    External Perineal Exam through clothing, no R pelvic floor tightness at anterior triangle              [facility] Adult PT Treatment/Exercise - 01/09/20 1646      Therapeutic Activites    Other Therapeutic Activities education on principles to 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       Neuro Re-ed    Neuro Re-ed Details  cued for better eccentric control with plyometric jumps, cued for anterior tilt of pelvic in hamstring stretch  on floor        Exercises   Exercises --   see pt instructions                   PT Short Term Goals - 01/03/20 1058      PT SHORT TERM GOAL #1   Title Pt will demo no pelvic floor mm tightness across 2 visits in order to  minimize risk for pelvic pain and to continue working out at the gym    Time 2    Period Weeks    Status Achieved    Target Date 01/17/20      PT SHORT TERM GOAL #2   Title Pt will demo proper lower kinetic chain alignment in his 5 min standing ab workout routine without cues in order to maintain pelvic girdle stability    Time 4    Period Weeks    Status Achieved    Target Date 01/31/20      PT SHORT TERM GOAL #3   Title Pt will demo no pelvic girdle hypomobility and malalignment in order to progress to fitness routine with less risk for relapse of pelvic pain    Time 2    Period Weeks    Status Ongoing    Target Date 01/17/20  Plan - 01/09/20 1659    Clinical Impression Statement Pt has achieved 2/3 STG and is progressing well towards remaining goal. Pt demo'd decreased pelvic floor tightness and required cues for proper lower kinetic chain co-activation to minimize tightening pelvic floor mm. Pt will benefit from self-management with HEP before d/c.    Personal Factors and Comorbidities Fitness    Stability/Clinical Decision Making Evolving/Moderate complexity    Rehab Potential Good    PT Frequency 1x / week    PT Duration 4 weeks    PT Treatment/Interventions Neuromuscular re-education;Moist Heat;Therapeutic exercise;Balance training;Patient/family education;Manual techniques;Gait training    Consulted and Agree with Plan of Care Patient           Patient will benefit from skilled therapeutic intervention in order to improve the following deficits and impairments:  Hypermobility, Hypomobility, Decreased strength, Decreased mobility, Improper body mechanics, Postural dysfunction, Increased muscle spasms, Decreased coordination  Visit Diagnosis: Other lack of coordination  Other muscle spasm  Sacrococcygeal disorders, not elsewhere classified     Problem List There are no problems to display for this patient.   Mariane Masters ,PT,  DPT, E-RYT  01/09/2020, 5:00 PM  [place] [facility] MAIN [facility] SERVICES [address] [place], [place], [address] Phone: [phone]   Fax:  [phone]  Name: Gordon Chang MRN: [id number] Date of Birth: 07/02/1986

## 2020-01-16 ENCOUNTER — Ambulatory Visit: Payer: BC Managed Care – PPO | Admitting: Physical Therapy

## 2020-01-23 ENCOUNTER — Ambulatory Visit: Payer: BC Managed Care – PPO | Admitting: Physical Therapy

## 2020-01-30 ENCOUNTER — Ambulatory Visit: Payer: BC Managed Care – PPO | Admitting: Physical Therapy

## 2020-02-06 ENCOUNTER — Ambulatory Visit: Payer: BC Managed Care – PPO | Admitting: Physical Therapy

## 2020-02-13 ENCOUNTER — Ambulatory Visit: Payer: BC Managed Care – PPO | Admitting: Physical Therapy

## 2020-02-27 ENCOUNTER — Ambulatory Visit: Payer: BC Managed Care – PPO | Admitting: Physical Therapy

## 2020-03-05 ENCOUNTER — Ambulatory Visit: Payer: BC Managed Care – PPO | Admitting: Physical Therapy

## 2020-03-12 ENCOUNTER — Ambulatory Visit: Payer: BC Managed Care – PPO | Admitting: Physical Therapy

## 2020-03-19 ENCOUNTER — Ambulatory Visit: Payer: BC Managed Care – PPO | Admitting: Physical Therapy

## 2020-05-14 ENCOUNTER — Encounter: Payer: BC Managed Care – PPO | Admitting: Physical Therapy

## 2020-09-28 ENCOUNTER — Ambulatory Visit
Admission: RE | Admit: 2020-09-28 | Discharge: 2020-09-28 | Disposition: A | Discharge status: Home / Self Care | Payer: BC Managed Care – PPO | Source: Ambulatory Visit | Attending: Family Medicine | Admitting: Family Medicine

## 2020-09-28 ENCOUNTER — Other Ambulatory Visit: Payer: Self-pay

## 2020-09-28 VITALS — BP 166/91 | HR 89 | Temp 98.2°F | Resp 16 | Wt 125.0 lb

## 2020-09-28 DIAGNOSIS — R300 Dysuria: Secondary | ICD-10-CM

## 2020-09-28 LAB — POCT URINALYSIS DIP (MANUAL ENTRY)
Blood, UA: NEGATIVE
Glucose, UA: NEGATIVE mg/dL
Leukocytes, UA: NEGATIVE
Nitrite, UA: NEGATIVE
Protein Ur, POC: NEGATIVE mg/dL
Spec Grav, UA: 1.025 (ref 1.010–1.025)
Urobilinogen, UA: 1 E.U./dL
pH, UA: 7 (ref 5.0–8.0)

## 2020-09-28 MED ORDER — AZITHROMYCIN 500 MG PO TABS
1000.0000 mg | ORAL_TABLET | Freq: Once | ORAL | Status: AC
  Administered 2020-09-28: 1000 mg via ORAL

## 2020-09-28 MED ORDER — CEFTRIAXONE SODIUM 500 MG IJ SOLR
500.0000 mg | Freq: Once | INTRAMUSCULAR | Status: AC
  Administered 2020-09-28: 500 mg via INTRAMUSCULAR

## 2020-09-28 NOTE — ED Triage Notes (Addendum)
Patient presents to Urgent Care with complaints of dysuria and some cloudiness to urine started last night. Concerned with possible STI.   Denies fever, hematuria, discharge.

## 2020-09-28 NOTE — Discharge Instructions (Signed)
STD testing done.  We have treated you here today for STDs.  Follow up as needed for continued or worsening symptoms

## 2020-09-29 NOTE — ED Provider Notes (Signed)
Gordon Chang    CSN: [id number] Arrival date & time: 09/28/20  1503      History   Chief Complaint Chief Complaint  Patient presents with  . SEXUALLY TRANSMITTED DISEASE    HPI Gordon Chang is a 35 y.o. male.   Patient is a 35 year old male that presents today with complaints of dysuria and cloudiness to urine.  This started last night.  Reporting new sexual partner prior to this starting.  Concerned about STI.  Denies any penile discharge, testicle pain or swelling.  Denies any fever, hematuria.      Past Medical History:  Diagnosis Date  . Asthma   . Kidney stone     There are no problems to display for this patient.   Past Surgical History:  Procedure Laterality Date  . HERNIA REPAIR         Home Medications    Prior to Admission medications   Medication Sig Start Date End Date Taking? Authorizing Provider  tamsulosin (FLOMAX) 0.4 MG CAPS capsule TAKE 1 CAPSULE BY MOUTH EVERY DAY 01/17/19   Vanna Scotland, MD    Family History Family History  Problem Relation Age of Onset  . Healthy Mother   . Healthy Father   . Prostate cancer Neg Hx   . Kidney cancer Neg Hx   . Bladder Cancer Neg Hx     Social History Social History   Tobacco Use  . Smoking status: Never Smoker  . Smokeless tobacco: Never Used  Substance Use Topics  . Alcohol use: Yes    Comment: rare  . Drug use: No     Allergies   Patient has no known allergies.   Review of Systems Review of Systems   Physical Exam Triage Vital Signs ED Triage Vitals  Enc Vitals Group     BP 09/28/20 1522 (!) 166/91     Pulse Rate 09/28/20 1522 89     Resp 09/28/20 1522 16     Temp 09/28/20 1522 98.2 F (36.8 C)     Temp Source 09/28/20 1522 Temporal     SpO2 09/28/20 1522 98 %     Weight 09/28/20 1520 125 lb (56.7 kg)     Height --      Head Circumference --      Peak Flow --      Pain Score 09/28/20 1520 0     Pain Loc --      Pain Edu? --      Excl. in GC? --    No  data found.  Updated Vital Signs BP (!) 166/91 (BP Location: Left Arm)   Pulse 89   Temp 98.2 F (36.8 C) (Temporal)   Resp 16   Wt 125 lb (56.7 kg)   SpO2 98%   BMI 19.58 kg/m   Visual Acuity Right Eye Distance:   Left Eye Distance:   Bilateral Distance:    Right Eye Near:   Left Eye Near:    Bilateral Near:     Physical Exam Vitals and nursing note reviewed.  Constitutional:      Appearance: Normal appearance.  HENT:     Head: Normocephalic and atraumatic.  Eyes:     Conjunctiva/sclera: Conjunctivae normal.  Pulmonary:     Effort: Pulmonary effort is normal.  Musculoskeletal:        General: Normal range of motion.     Cervical back: Normal range of motion.  Skin:    General: Skin is warm and dry.  Neurological:     Mental Status: He is alert.  Psychiatric:        Mood and Affect: Mood normal.      UC Treatments / Results  Labs (all labs ordered are listed, but only abnormal results are displayed) Labs Reviewed  POCT URINALYSIS DIP (MANUAL ENTRY) - Abnormal; Notable for the following components:      Result Value   Bilirubin, UA small (*)    Ketones, POC UA large (80) (*)    All other components within normal limits  CYTOLOGY, (ORAL, ANAL, URETHRAL) ANCILLARY ONLY    EKG   Radiology No results found.  Procedures Procedures (including critical care time)  Medications Ordered in UC Medications  cefTRIAXone (ROCEPHIN) injection 500 mg (500 mg Intramuscular Given 09/28/20 1555)  azithromycin (ZITHROMAX) tablet 1,000 mg (1,000 mg Oral Given 09/28/20 1555)    Initial Impression / Assessment and Plan / UC Course  I have reviewed the triage vital signs and the nursing notes.  Pertinent labs & imaging results that were available during my care of the patient were reviewed by me and considered in my medical decision making (see chart for details).     Dysuria Patient requesting treatment and testing for STDs at this time.  Will treat  prophylactically based on symptoms and recent sexual encounter with Rocephin and Zithromax Swab sent for testing. Urine with large ketones and small bilirubin but otherwise no concerns for UTI at this time. Follow up as needed for continued or worsening symptoms  Final Clinical Impressions(s) / UC Diagnoses   Final diagnoses:  Dysuria     Discharge Instructions     STD testing done.  We have treated you here today for STDs.  Follow up as needed for continued or worsening symptoms     ED Prescriptions    None     PDMP not reviewed this encounter.   Dahlia Byes A, NP 09/29/20 1054

## 2020-09-30 LAB — CYTOLOGY, (ORAL, ANAL, URETHRAL) ANCILLARY ONLY
Chlamydia: NEGATIVE
Comment: NEGATIVE
Comment: NEGATIVE
Comment: NORMAL
Neisseria Gonorrhea: NEGATIVE
Trichomonas: NEGATIVE

## 2021-01-08 ENCOUNTER — Other Ambulatory Visit: Payer: Self-pay | Admitting: Internal Medicine

## 2021-01-08 ENCOUNTER — Other Ambulatory Visit (HOSPITAL_COMMUNITY): Payer: Self-pay | Admitting: Internal Medicine

## 2021-01-08 DIAGNOSIS — R109 Unspecified abdominal pain: Secondary | ICD-10-CM

## 2021-01-19 ENCOUNTER — Ambulatory Visit: Payer: Self-pay | Admitting: Urology

## 2021-01-21 ENCOUNTER — Other Ambulatory Visit: Payer: Self-pay

## 2021-01-21 ENCOUNTER — Ambulatory Visit
Admission: RE | Admit: 2021-01-21 | Discharge: 2021-01-21 | Disposition: A | Discharge status: Home / Self Care | Payer: BC Managed Care – PPO | Source: Ambulatory Visit | Attending: Internal Medicine | Admitting: Internal Medicine

## 2021-01-21 DIAGNOSIS — R109 Unspecified abdominal pain: Secondary | ICD-10-CM | POA: Diagnosis not present

## 2021-01-25 ENCOUNTER — Ambulatory Visit: Payer: BC Managed Care – PPO

## 2021-01-28 ENCOUNTER — Ambulatory Visit
Admission: RE | Admit: 2021-01-28 | Discharge: 2021-01-28 | Disposition: A | Discharge status: Home / Self Care | Payer: BC Managed Care – PPO | Source: Ambulatory Visit | Attending: Urology | Admitting: Urology

## 2021-01-28 ENCOUNTER — Other Ambulatory Visit: Payer: Self-pay | Admitting: *Deleted

## 2021-01-28 ENCOUNTER — Ambulatory Visit
Admission: RE | Admit: 2021-01-28 | Discharge: 2021-01-28 | Disposition: A | Discharge status: Home / Self Care | Payer: BC Managed Care – PPO | Attending: Urology | Admitting: Urology

## 2021-01-28 DIAGNOSIS — N200 Calculus of kidney: Secondary | ICD-10-CM

## 2021-01-28 NOTE — H&P (View-Only) (Signed)
01/29/2021 9:03 AM   Gordon Chang 11-25-1985 [id number]  Referring provider: Kirk Ruths, MD [place] [facility] [place],  [place] [address]  Urological history: 1.  Epididymitis -normal scrotal ultrasound 2019 -last episode 2018  2.  Prostatitis -TRUS 2020 - 19.9 gm prostate measuring 2.39 x 2.39 x 4.29 cm (length) - no abscess -last episode 2020  3.  Nephrolithiasis -CT renal stone study 01/2021 - 3 mm calcification within the right hemipelvis which was not present on the previous CT and may reflect a non obstructing stone within the distal right ureter. No hydronephrosis.  3 mm nonobstructing stone within the midpole of the left kidney. -History of spontaneously passed stones  Chief Complaint  Patient presents with   Nephrolithiasis    HPI: Gordon Chang is a 35 y.o. male who presents today for right sided flank pain secondary to a right ureteral stone.    He was seen on January 01, 2021 at his PCPs office for right-sided flank pain.  He was found to have greater than 50 RBCs on urinalysis and was given pain medication and tamsulosin.  He was also scheduled for an appointment with [place] on January 19, 2021, but he cancelled the appointment as he thought he passed his stone.    He then called his primary care office last week stating the pain then returned and a CT renal stone study 01/22/2021 was was performed and noted a calcification approximately 3 mm in size suspicious for a right mid ureteral stone.  Pain started in the right flank, but pain is now in the lower pelvis associated with dysuria and intermittent gross heme.  Patient denies any modifying or aggravating factors.  Patient denies any flank pain.  Patient denies any fevers, chills, nausea or vomiting.    UA 11-30 RBC's  KUB some pelvic phleboliths are noted making visualization of a stone difficult  PMH: Past Medical History:  Diagnosis Date   Asthma    Kidney stone     Surgical  History: Past Surgical History:  Procedure Laterality Date   HERNIA REPAIR      Home Medications:  Allergies as of 01/29/2021   No Known Allergies      Medication List        Accurate as of January 29, 2021  9:03 AM. If you have any questions, ask your nurse or doctor.          oxyCODONE-acetaminophen 5-325 MG tablet Commonly known as: Percocet Take 1 tablet by mouth every 8 (eight) hours as needed for severe pain. Started by: Zara Council, PA-C   tamsulosin 0.4 MG Caps capsule Commonly known as: FLOMAX Take 1 capsule (0.4 mg total) by mouth daily.        Allergies: No Known Allergies  Family History: Family History  Problem Relation Age of Onset   Healthy Mother    Healthy Father    Prostate cancer Neg Hx    Kidney cancer Neg Hx    Bladder Cancer Neg Hx     Social History:  reports that he has never smoked. He has never used smokeless tobacco. He reports current alcohol use. He reports that he does not use drugs.  ROS: Pertinent ROS in HPI  Physical Exam: BP (!) 156/96   Pulse 61   Ht _0  (1.702 m)   Wt 125 lb (56.7 kg)   BMI 19.58 kg/m   Constitutional:  Well nourished. Alert and oriented, No acute distress.  HEENT: [place] AT, mask in place.  Trachea midline Cardiovascular: No clubbing, cyanosis, or edema. Respiratory: Normal respiratory effort, no increased work of breathing. Neurologic: Grossly intact, no focal deficits, moving all 4 extremities. Psychiatric: Normal mood and affect.  Laboratory Data: Urinalysis Component     Latest Ref Rng & Units 01/29/2021  Specific Gravity, UA     1.005 - 1.030 1.020  pH, UA     5.0 - 7.5 7.0  Color, UA     Yellow Yellow  Appearance Ur     Clear Clear  Leukocytes,UA     Negative Negative  Protein,UA     Negative/Trace Negative  Glucose, UA     Negative Negative  Ketones, UA     Negative Negative  RBC, UA     Negative 1+ (A)  Bilirubin, UA     Negative Negative  Urobilinogen, Ur     0.2 - 1.0  mg/dL 0.2  Nitrite, UA     Negative Negative  Microscopic Examination      See below:   Component     Latest Ref Rng & Units 01/29/2021  WBC, UA     0 - 5 /hpf 0-5  RBC     0 - 2 /hpf 11-30 (A)  Epithelial Cells (non renal)     0 - 10 /hpf 0-10  Casts     None seen /lpf Present (A)  Cast Type     N/A Hyaline casts  Bacteria, UA     None seen/Few None seen     I have reviewed the labs.   Pertinent Imaging: CLINICAL DATA:  Right flank pain, lower abdominal pain   EXAM: ABDOMEN - 1 VIEW   COMPARISON:  01/21/2021   FINDINGS: Two supine frontal views of the abdomen and pelvis are obtained. Punctate 3 mm left renal calculus seen on prior study is partially obscured by overlying bowel gas on today's exam. The 3 mm rounded calcification in the right hemipelvis on prior CT appears to of progressed distally and medially, now to the right of the sacrococcygeal junction measuring 3 mm on today's exam, compatible with a distal right ureteral calculus. Other pelvic calcifications are stable since CT consistent with phleboliths. Postsurgical changes from right inguinal hernia repair.   IMPRESSION: 1. Distal migration of the right ureteral calculus seen on prior CT, now likely in the region of the UVJ. 2. Stable left renal calculus, partially obscured by bowel gas.     Electronically Signed   By: Randa Ngo M.D.   On: 01/31/2021 09:07 I have independently reviewed the films.  See HPI.    Assessment & Plan:    1. Right ureteral stone -explained to the patient that since their stone is  ?10 mm, it is in within AUA Guidelines to continue MET with tamsulosin and pushing fluids for 4 to 6 weeks  -patient is not wanting to pursue MET therapy at this time, I explained that URS would be the first line therapy if stone does not pass, ESWL may be less successful as the pelvic calcification may not be the migrating stone -patient prefers URS as it can be performed Monday   -UA -Urine culture pending -Septra DS started empirically as he endorses dysuria  -Percocet 5/325, # 10, one every 6 hours prn for pain - schedule right ureteroscopy with laser lithotripsy and ureteral stent placement - explained to the patient how the procedure is performed and the risks involved - informed patient that they will  have a stent placed during the procedure and will remain in place after the procedure for a short time.  - stent may be removed in the office with a cystoscope or patient may be instructed to remove the stent themselves by the string - described "stent pain" as feelings of needing to urinate/overactive bladder and a warm, tingling sensation to intense pain in the affected flank - residual stones within the kidney or ureter may be present after the procedure and may need to have these addressed at a different encounter - injury to the ureter is the most common intra-operative risk, it may result in an open procedure to correct the defect - infection and bleeding are also risks - explained the risks of general anesthesia, such as: MI, CVA, paralysis, coma and/or death. - advised to contact our office or seek treatment in the ED if becomes febrile or pain/ vomiting are difficult control in order to arrange for emergent/urgent intervention    Return for right URS/LL/ureteral stent placement.  These notes generated with voice recognition software. I apologize for typographical errors.  Zara Council, PA-C  [facility] [address]  [place], [place] [address] [phone]

## 2021-01-28 NOTE — Progress Notes (Signed)
01/29/2021 9:03 AM   Gordon Chang 11-25-1985 [id number]  Referring provider: Kirk Ruths, MD [facility] [facility] [place],  [place] [address]  Urological history: 1.  Epididymitis -normal scrotal ultrasound 2019 -last episode 2018  2.  Prostatitis -TRUS 2020 - 19.9 gm prostate measuring 2.39 x 2.39 x 4.29 cm (length) - no abscess -last episode 2020  3.  Nephrolithiasis -CT renal stone study 01/2021 - 3 mm calcification within the right hemipelvis which was not present on the previous CT and may reflect a non obstructing stone within the distal right ureter. No hydronephrosis.  3 mm nonobstructing stone within the midpole of the left kidney. -History of spontaneously passed stones  Chief Complaint  Patient presents with   Nephrolithiasis    HPI: Gordon Chang is a 35 y.o. male who presents today for right sided flank pain secondary to a right ureteral stone.    He was seen on January 01, 2021 at his PCPs office for right-sided flank pain.  He was found to have greater than 50 RBCs on urinalysis and was given pain medication and tamsulosin.  He was also scheduled for an appointment with [place] on January 19, 2021, but he cancelled the appointment as he thought he passed his stone.    He then called his primary care office last week stating the pain then returned and a CT renal stone study 01/22/2021 was was performed and noted a calcification approximately 3 mm in size suspicious for a right mid ureteral stone.  Pain started in the right flank, but pain is now in the lower pelvis associated with dysuria and intermittent gross heme.  Patient denies any modifying or aggravating factors.  Patient denies any flank pain.  Patient denies any fevers, chills, nausea or vomiting.    UA 11-30 RBC's  KUB some pelvic phleboliths are noted making visualization of a stone difficult  PMH: Past Medical History:  Diagnosis Date   Asthma    Kidney stone     Surgical  History: Past Surgical History:  Procedure Laterality Date   HERNIA REPAIR      Home Medications:  Allergies as of 01/29/2021   No Known Allergies      Medication List        Accurate as of January 29, 2021  9:03 AM. If you have any questions, ask your nurse or doctor.          oxyCODONE-acetaminophen 5-325 MG tablet Commonly known as: Percocet Take 1 tablet by mouth every 8 (eight) hours as needed for severe pain. Started by: Zara Council, PA-C   tamsulosin 0.4 MG Caps capsule Commonly known as: FLOMAX Take 1 capsule (0.4 mg total) by mouth daily.        Allergies: No Known Allergies  Family History: Family History  Problem Relation Age of Onset   Healthy Mother    Healthy Father    Prostate cancer Neg Hx    Kidney cancer Neg Hx    Bladder Cancer Neg Hx     Social History:  reports that he has never smoked. He has never used smokeless tobacco. He reports current alcohol use. He reports that he does not use drugs.  ROS: Pertinent ROS in HPI  Physical Exam: BP (!) 156/96   Pulse 61   Ht _0  (1.702 m)   Wt 125 lb (56.7 kg)   BMI 19.58 kg/m   Constitutional:  Well nourished. Alert and oriented, No acute distress.  HEENT: [place] AT, mask in place.  Trachea midline Cardiovascular: No clubbing, cyanosis, or edema. Respiratory: Normal respiratory effort, no increased work of breathing. Neurologic: Grossly intact, no focal deficits, moving all 4 extremities. Psychiatric: Normal mood and affect.  Laboratory Data: Urinalysis Component     Latest Ref Rng & Units 01/29/2021  Specific Gravity, UA     1.005 - 1.030 1.020  pH, UA     5.0 - 7.5 7.0  Color, UA     Yellow Yellow  Appearance Ur     Clear Clear  Leukocytes,UA     Negative Negative  Protein,UA     Negative/Trace Negative  Glucose, UA     Negative Negative  Ketones, UA     Negative Negative  RBC, UA     Negative 1+ (A)  Bilirubin, UA     Negative Negative  Urobilinogen, Ur     0.2 - 1.0  mg/dL 0.2  Nitrite, UA     Negative Negative  Microscopic Examination      See below:   Component     Latest Ref Rng & Units 01/29/2021  WBC, UA     0 - 5 /hpf 0-5  RBC     0 - 2 /hpf 11-30 (A)  Epithelial Cells (non renal)     0 - 10 /hpf 0-10  Casts     None seen /lpf Present (A)  Cast Type     N/A Hyaline casts  Bacteria, UA     None seen/Few None seen     I have reviewed the labs.   Pertinent Imaging: CLINICAL DATA:  Right flank pain, lower abdominal pain   EXAM: ABDOMEN - 1 VIEW   COMPARISON:  01/21/2021   FINDINGS: Two supine frontal views of the abdomen and pelvis are obtained. Punctate 3 mm left renal calculus seen on prior study is partially obscured by overlying bowel gas on today's exam. The 3 mm rounded calcification in the right hemipelvis on prior CT appears to of progressed distally and medially, now to the right of the sacrococcygeal junction measuring 3 mm on today's exam, compatible with a distal right ureteral calculus. Other pelvic calcifications are stable since CT consistent with phleboliths. Postsurgical changes from right inguinal hernia repair.   IMPRESSION: 1. Distal migration of the right ureteral calculus seen on prior CT, now likely in the region of the UVJ. 2. Stable left renal calculus, partially obscured by bowel gas.     Electronically Signed   By: Michael  Brown M.D.   On: 01/31/2021 09:07 I have independently reviewed the films.  See HPI.    Assessment & Plan:    1. Right ureteral stone -explained to the patient that since their stone is  ?10 mm, it is in within AUA Guidelines to continue MET with tamsulosin and pushing fluids for 4 to 6 weeks  -patient is not wanting to pursue MET therapy at this time, I explained that URS would be the first line therapy if stone does not pass, ESWL may be less successful as the pelvic calcification may not be the migrating stone -patient prefers URS as it can be performed Monday   -UA -Urine culture pending -Septra DS started empirically as he endorses dysuria  -Percocet 5/325, # 10, one every 6 hours prn for pain - schedule right ureteroscopy with laser lithotripsy and ureteral stent placement - explained to the patient how the procedure is performed and the risks involved - informed patient that they will   have a stent placed during the procedure and will remain in place after the procedure for a short time.  - stent may be removed in the office with a cystoscope or patient may be instructed to remove the stent themselves by the string - described "stent pain" as feelings of needing to urinate/overactive bladder and a warm, tingling sensation to intense pain in the affected flank - residual stones within the kidney or ureter may be present after the procedure and may need to have these addressed at a different encounter - injury to the ureter is the most common intra-operative risk, it may result in an open procedure to correct the defect - infection and bleeding are also risks - explained the risks of general anesthesia, such as: MI, CVA, paralysis, coma and/or death. - advised to contact our office or seek treatment in the ED if becomes febrile or pain/ vomiting are difficult control in order to arrange for emergent/urgent intervention    Return for right URS/LL/ureteral stent placement.  These notes generated with voice recognition software. I apologize for typographical errors.  Zara Council, PA-C  [facility] [address]  [place], [place] [address] [phone]

## 2021-01-29 ENCOUNTER — Encounter: Payer: Self-pay | Admitting: Urology

## 2021-01-29 ENCOUNTER — Ambulatory Visit (INDEPENDENT_AMBULATORY_CARE_PROVIDER_SITE_OTHER): Payer: BC Managed Care – PPO | Admitting: Urology

## 2021-01-29 ENCOUNTER — Other Ambulatory Visit: Payer: Self-pay

## 2021-01-29 VITALS — BP 156/96 | HR 61 | Ht 67.0 in | Wt 125.0 lb

## 2021-01-29 DIAGNOSIS — N201 Calculus of ureter: Secondary | ICD-10-CM

## 2021-01-29 LAB — URINALYSIS, COMPLETE
Bilirubin, UA: NEGATIVE
Glucose, UA: NEGATIVE
Ketones, UA: NEGATIVE
Leukocytes,UA: NEGATIVE
Nitrite, UA: NEGATIVE
Protein,UA: NEGATIVE
Specific Gravity, UA: 1.02 (ref 1.005–1.030)
Urobilinogen, Ur: 0.2 mg/dL (ref 0.2–1.0)
pH, UA: 7 (ref 5.0–7.5)

## 2021-01-29 LAB — MICROSCOPIC EXAMINATION: Bacteria, UA: NONE SEEN

## 2021-01-29 MED ORDER — SULFAMETHOXAZOLE-TRIMETHOPRIM 800-160 MG PO TABS
1.0000 | ORAL_TABLET | Freq: Two times a day (BID) | ORAL | 0 refills | Status: DC
Start: 2021-01-29 — End: 2021-03-10

## 2021-01-29 MED ORDER — OXYCODONE-ACETAMINOPHEN 5-325 MG PO TABS: 1.0000 | ORAL_TABLET | Freq: Three times a day (TID) | ORAL | 0 refills | Status: DC | PRN

## 2021-01-29 MED ORDER — TAMSULOSIN HCL 0.4 MG PO CAPS: 0.4000 mg | ORAL_CAPSULE | Freq: Every day | ORAL | 0 refills | Status: DC

## 2021-02-01 ENCOUNTER — Other Ambulatory Visit: Payer: Self-pay

## 2021-02-01 ENCOUNTER — Ambulatory Visit: Payer: BC Managed Care – PPO | Admitting: Anesthesiology

## 2021-02-01 ENCOUNTER — Ambulatory Visit
Admission: RE | Admit: 2021-02-01 | Discharge: 2021-02-01 | Disposition: A | Discharge status: Home / Self Care | Payer: BC Managed Care – PPO | Attending: Urology | Admitting: Urology

## 2021-02-01 ENCOUNTER — Ambulatory Visit: Payer: BC Managed Care – PPO

## 2021-02-01 ENCOUNTER — Encounter
Admission: RE | Disposition: A | Discharge status: Home / Self Care | Payer: Self-pay | Source: Home / Self Care | Attending: Urology

## 2021-02-01 ENCOUNTER — Encounter: Payer: Self-pay | Admitting: Urology

## 2021-02-01 DIAGNOSIS — Q6210 Congenital occlusion of ureter, unspecified: Secondary | ICD-10-CM | POA: Diagnosis not present

## 2021-02-01 DIAGNOSIS — N201 Calculus of ureter: Secondary | ICD-10-CM

## 2021-02-01 DIAGNOSIS — Z87442 Personal history of urinary calculi: Secondary | ICD-10-CM | POA: Diagnosis not present

## 2021-02-01 DIAGNOSIS — N2889 Other specified disorders of kidney and ureter: Secondary | ICD-10-CM | POA: Insufficient documentation

## 2021-02-01 HISTORY — PX: CYSTOSCOPY/URETEROSCOPY/HOLMIUM LASER/STENT PLACEMENT: SHX6546

## 2021-02-01 SURGERY — CYSTOSCOPY/URETEROSCOPY/HOLMIUM LASER/STENT PLACEMENT
Anesthesia: General | Site: Ureter | Laterality: Right

## 2021-02-01 MED ORDER — FENTANYL CITRATE (PF) 100 MCG/2ML IJ SOLN
25.0000 ug | INTRAMUSCULAR | Status: DC | PRN
  Administered 2021-02-01: 25 ug via INTRAVENOUS

## 2021-02-01 MED ORDER — IOHEXOL 180 MG/ML  SOLN
INTRAMUSCULAR | Status: DC | PRN
  Administered 2021-02-01: 10 mL

## 2021-02-01 MED ORDER — DEXAMETHASONE SODIUM PHOSPHATE 10 MG/ML IJ SOLN
INTRAMUSCULAR | Status: DC | PRN
  Administered 2021-02-01: 10 mg via INTRAVENOUS

## 2021-02-01 MED ORDER — EPHEDRINE SULFATE 50 MG/ML IJ SOLN
INTRAMUSCULAR | Status: DC | PRN
  Administered 2021-02-01: 5 mg via INTRAVENOUS

## 2021-02-01 MED ORDER — PROPOFOL 10 MG/ML IV BOLUS
INTRAVENOUS | Status: AC
  Filled 2021-02-01: qty 20

## 2021-02-01 MED ORDER — CEFAZOLIN SODIUM-DEXTROSE 2-4 GM/100ML-% IV SOLN
INTRAVENOUS | Status: AC
  Filled 2021-02-01: qty 100

## 2021-02-01 MED ORDER — FENTANYL CITRATE (PF) 100 MCG/2ML IJ SOLN
INTRAMUSCULAR | Status: AC
  Filled 2021-02-01: qty 2

## 2021-02-01 MED ORDER — ONDANSETRON HCL 4 MG/2ML IJ SOLN: 4.0000 mg | Freq: Once | INTRAMUSCULAR | Status: DC | PRN

## 2021-02-01 MED ORDER — MIDAZOLAM HCL 2 MG/2ML IJ SOLN
INTRAMUSCULAR | Status: DC | PRN
  Administered 2021-02-01: 2 mg via INTRAVENOUS

## 2021-02-01 MED ORDER — DEXAMETHASONE SODIUM PHOSPHATE 10 MG/ML IJ SOLN
INTRAMUSCULAR | Status: AC
  Filled 2021-02-01: qty 1

## 2021-02-01 MED ORDER — FENTANYL CITRATE (PF) 100 MCG/2ML IJ SOLN
INTRAMUSCULAR | Status: AC
  Administered 2021-02-01: 25 ug via INTRAVENOUS
  Filled 2021-02-01: qty 2

## 2021-02-01 MED ORDER — ONDANSETRON HCL 4 MG/2ML IJ SOLN
INTRAMUSCULAR | Status: DC | PRN
  Administered 2021-02-01: 4 mg via INTRAVENOUS

## 2021-02-01 MED ORDER — STERILE WATER FOR IRRIGATION IR SOLN
Status: DC | PRN
  Administered 2021-02-01: 250 mL

## 2021-02-01 MED ORDER — LIDOCAINE HCL (CARDIAC) PF 100 MG/5ML IV SOSY
PREFILLED_SYRINGE | INTRAVENOUS | Status: DC | PRN
  Administered 2021-02-01: 100 mg via INTRAVENOUS

## 2021-02-01 MED ORDER — PHENYLEPHRINE HCL (PRESSORS) 10 MG/ML IV SOLN
INTRAVENOUS | Status: DC | PRN
  Administered 2021-02-01: 50 ug via INTRAVENOUS
  Administered 2021-02-01: 100 ug via INTRAVENOUS

## 2021-02-01 MED ORDER — CEFAZOLIN SODIUM-DEXTROSE 2-4 GM/100ML-% IV SOLN
2.0000 g | INTRAVENOUS | Status: AC
  Administered 2021-02-01: 2 g via INTRAVENOUS

## 2021-02-01 MED ORDER — CHLORHEXIDINE GLUCONATE 0.12 % MT SOLN
OROMUCOSAL | Status: AC
  Administered 2021-02-01: 15 mL via OROMUCOSAL
  Filled 2021-02-01: qty 15

## 2021-02-01 MED ORDER — FENTANYL CITRATE (PF) 100 MCG/2ML IJ SOLN
INTRAMUSCULAR | Status: DC | PRN
  Administered 2021-02-01: 100 ug via INTRAVENOUS

## 2021-02-01 MED ORDER — ONDANSETRON HCL 4 MG/2ML IJ SOLN
INTRAMUSCULAR | Status: AC
  Filled 2021-02-01: qty 2

## 2021-02-01 MED ORDER — ORAL CARE MOUTH RINSE: 15.0000 mL | Freq: Once | OROMUCOSAL | Status: AC

## 2021-02-01 MED ORDER — CHLORHEXIDINE GLUCONATE 0.12 % MT SOLN: 15.0000 mL | Freq: Once | OROMUCOSAL | Status: AC

## 2021-02-01 MED ORDER — OXYBUTYNIN CHLORIDE 5 MG PO TABS: 5.0000 mg | ORAL_TABLET | Freq: Three times a day (TID) | ORAL | 0 refills | Status: DC | PRN

## 2021-02-01 MED ORDER — 0.9 % SODIUM CHLORIDE (POUR BTL) OPTIME
TOPICAL | Status: DC | PRN
  Administered 2021-02-01: 1000 mL

## 2021-02-01 MED ORDER — OXYCODONE-ACETAMINOPHEN 5-325 MG PO TABS
1.0000 | ORAL_TABLET | Freq: Three times a day (TID) | ORAL | 0 refills | Status: DC | PRN
Start: 2021-02-01 — End: 2021-03-10

## 2021-02-01 MED ORDER — MIDAZOLAM HCL 2 MG/2ML IJ SOLN
INTRAMUSCULAR | Status: AC
  Filled 2021-02-01: qty 2

## 2021-02-01 MED ORDER — LACTATED RINGERS IV SOLN: INTRAVENOUS | Status: DC

## 2021-02-01 MED ORDER — PROPOFOL 10 MG/ML IV BOLUS
INTRAVENOUS | Status: DC | PRN
  Administered 2021-02-01: 200 mg via INTRAVENOUS

## 2021-02-01 SURGICAL SUPPLY — 32 items
ADH LQ OCL WTPRF AMP STRL LF (MISCELLANEOUS) ×1
ADHESIVE MASTISOL STRL (MISCELLANEOUS) ×2 IMPLANT
BAG DRAIN CYSTO-URO LG1000N (MISCELLANEOUS) ×2 IMPLANT
BASKET ZERO TIP 1.9FR (BASKET) IMPLANT
BRUSH SCRUB EZ 1% IODOPHOR (MISCELLANEOUS) ×2 IMPLANT
BSKT STON RTRVL ZERO TP 1.9FR (BASKET)
CATH URET FLEX-TIP 2 LUMEN 10F (CATHETERS) IMPLANT
CATH URETL OPEN 5X70 (CATHETERS) ×2 IMPLANT
CNTNR SPEC 2.5X3XGRAD LEK (MISCELLANEOUS)
CONT SPEC 4OZ STER OR WHT (MISCELLANEOUS)
CONT SPEC 4OZ STRL OR WHT (MISCELLANEOUS)
CONTAINER SPEC 2.5X3XGRAD LEK (MISCELLANEOUS) IMPLANT
DRAPE UTILITY 15X26 TOWEL STRL (DRAPES) ×2 IMPLANT
DRSG TEGADERM 2-3/8X2-3/4 SM (GAUZE/BANDAGES/DRESSINGS) ×2 IMPLANT
GAUZE 4X4 16PLY ~~LOC~~+RFID DBL (SPONGE) ×4 IMPLANT
GLOVE SURG ENC MOIS LTX SZ6.5 (GLOVE) ×2 IMPLANT
GOWN STRL REUS W/ TWL LRG LVL3 (GOWN DISPOSABLE) ×2 IMPLANT
GOWN STRL REUS W/TWL LRG LVL3 (GOWN DISPOSABLE) ×4
GUIDEWIRE GREEN .038 145CM (MISCELLANEOUS) IMPLANT
GUIDEWIRE STR DUAL SENSOR (WIRE) ×2 IMPLANT
INFUSOR MANOMETER BAG 3000ML (MISCELLANEOUS) ×2 IMPLANT
IV NS IRRIG 3000ML ARTHROMATIC (IV SOLUTION) ×2 IMPLANT
KIT TURNOVER CYSTO (KITS) ×2 IMPLANT
MANIFOLD NEPTUNE II (INSTRUMENTS) ×2 IMPLANT
PACK CYSTO AR (MISCELLANEOUS) ×2 IMPLANT
SET CYSTO W/LG BORE CLAMP LF (SET/KITS/TRAYS/PACK) ×2 IMPLANT
SHEATH URETERAL 12FRX35CM (MISCELLANEOUS) IMPLANT
STENT URET 6FRX24 CONTOUR (STENTS) ×2 IMPLANT
STENT URET 6FRX26 CONTOUR (STENTS) IMPLANT
SURGILUBE 2OZ TUBE FLIPTOP (MISCELLANEOUS) ×2 IMPLANT
TRACTIP FLEXIVA PULSE ID 200 (Laser) ×2 IMPLANT
WATER STERILE IRR 1000ML POUR (IV SOLUTION) ×2 IMPLANT

## 2021-02-01 NOTE — Interval H&P Note (Signed)
History and Physical Interval Note:  02/01/2021 4:22 PM  Gordon Chang  has presented today for surgery, with the diagnosis of Right Ureteral Stone.  The various methods of treatment have been discussed with the patient and family. After consideration of risks, benefits and other options for treatment, the patient has consented to  Procedure(s): CYSTOSCOPY/URETEROSCOPY/HOLMIUM LASER/STENT PLACEMENT (Right) as a surgical intervention.  The patient's history has been reviewed, patient examined, no change in status, stable for surgery.  I have reviewed the patient's chart and labs.  Questions were answered to the patient's satisfaction.    RRR CTAB   Gordon Chang

## 2021-02-01 NOTE — Transfer of Care (Signed)
Immediate Anesthesia Transfer of Care Note  Patient: Gordon Chang  Procedure(s) Performed: CYSTOSCOPY/URETEROSCOPY/HOLMIUM LASER/STENT PLACEMENT (Right: Ureter)  Patient Location: PACU  Anesthesia Type:General  Level of Consciousness: drowsy  Airway & Oxygen Therapy: Patient Spontanous Breathing and Patient connected to face mask oxygen  Post-op Assessment: Report given to RN and Post -op Vital signs reviewed and stable  Post vital signs: Reviewed and stable  Last Vitals:  Vitals Value Taken Time  BP 103/53 02/01/21 1724  Temp    Pulse 61 02/01/21 1728  Resp 14 02/01/21 1728  SpO2 97 % 02/01/21 1728  Vitals shown include unvalidated device data.  Last Pain:  Vitals:   02/01/21 1407  TempSrc: Temporal  PainSc: 5          Complications: No notable events documented.

## 2021-02-01 NOTE — Anesthesia Procedure Notes (Signed)
Procedure Name: LMA Insertion Date/Time: 02/01/2021 4:35 PM Performed by: Katherine Basset, CRNA Pre-anesthesia Checklist: Patient identified, Emergency Drugs available, Suction available and Patient being monitored Patient Re-evaluated:Patient Re-evaluated prior to induction Oxygen Delivery Method: Circle system utilized Preoxygenation: Pre-oxygenation with 100% oxygen Induction Type: IV induction LMA: LMA inserted LMA Size: 4.0 Grade View: Grade I Number of attempts: 1 Airway Equipment and Method: Patient positioned with wedge pillow Tube secured with: Tape Dental Injury: Teeth and Oropharynx as per pre-operative assessment

## 2021-02-01 NOTE — Anesthesia Preprocedure Evaluation (Signed)
Anesthesia Evaluation  Patient identified by MRN, date of birth, ID band Patient awake    Reviewed: Allergy & Precautions, H&P , NPO status , Patient's Chart, lab work & pertinent test results, reviewed documented beta blocker date and time   Airway Mallampati: II  TM Distance: >3 FB Neck ROM: full    Dental  (+) Teeth Intact   Pulmonary neg pulmonary ROS, asthma ,    Pulmonary exam normal        Cardiovascular Exercise Tolerance: Good negative cardio ROS Normal cardiovascular exam Rhythm:regular Rate:Normal     Neuro/Psych negative neurological ROS  negative psych ROS   GI/Hepatic negative GI ROS, Neg liver ROS,   Endo/Other  negative endocrine ROS  Renal/GU Renal disease  negative genitourinary   Musculoskeletal   Abdominal   Peds  Hematology negative hematology ROS (+)   Anesthesia Other Findings Past Medical History: No date: Asthma No date: Kidney stone Past Surgical History: No date: HERNIA REPAIR BMI    Body Mass Index: 19.58 kg/m     Reproductive/Obstetrics negative OB ROS                             Anesthesia Physical Anesthesia Plan  ASA: 2  Anesthesia Plan: General ETT   Post-op Pain Management:    Induction:   PONV Risk Score and Plan: 3  Airway Management Planned:   Additional Equipment:   Intra-op Plan:   Post-operative Plan:   Informed Consent: I have reviewed the patients History and Physical, chart, labs and discussed the procedure including the risks, benefits and alternatives for the proposed anesthesia with the patient or authorized representative who has indicated his/her understanding and acceptance.     Dental Advisory Given  Plan Discussed with: CRNA  Anesthesia Plan Comments:         Anesthesia Quick Evaluation

## 2021-02-01 NOTE — Op Note (Signed)
Date of procedure: 02/01/21  Preoperative diagnosis:  Right distal ureteral stone Right flank pain  Postoperative diagnosis:  Same as above  Procedure: Right ureteroscopy laser lithotripsy Basket extraction of stone fragment Right ureteral retrograde pyelogram Interpretation fluoroscopy less than 30 minutes Right ureteral stent placement on tether  Surgeon: Vanna Scotland, MD  Anesthesia: General  Complications: None  Intraoperative findings: 3 mm impacted right distal ureteral calculus with small mucosal abrasion and narrowing at the level of the stone.  Stent placed on tether.  EBL: Minimal  Specimens: None  Drains: 6 x 24 French double-J ureteral stent on right  Indication: Gordon Chang is a 35 y.o. patient with retained 3 mm symptomatic right distal ureteral calculus.  After reviewing the management options for treatment, he elected to proceed with the above surgical procedure(s). We have discussed the potential benefits and risks of the procedure, side effects of the proposed treatment, the likelihood of the patient achieving the goals of the procedure, and any potential problems that might occur during the procedure or recuperation. Informed consent has been obtained.  Description of procedure:  The patient was taken to the operating room and general anesthesia was induced.  The patient was placed in the dorsal lithotomy position, prepped and draped in the usual sterile fashion, and preoperative antibiotics were administered. A preoperative time-out was performed.   A 21 French scope was advanced per urethra into the bladder.  Attention was turned to the right ureteral orifice.  On scout imaging, the stone in question was not easily seen as he had multiple calcifications pelvis.  A gentle retrograde pyelogram was performed by intubating the UO just within the orifice with contrast which was injected.  This revealed no hydronephrosis, hydroureter or obvious filling defects.   I then placed wire to the level of the kidney and did feel some scraping in the distal ureter consistent with probable retained stone.  This was snapped in place as a safety wire.  A 4.5 long semirigid ureteroscope was then advanced up to the level of the stone which was encountered in the very distal aspect of the ureter.  Is very tight in this area and using a 200 m laser fiber and settings of 0.3 J and 60 Hz, I popped the stone into 5 or 6 pieces.  Due to the extreme tightness, I ended up having to basket one of the pieces out which retropulsed slightly above this area.  At the end of the procedure, all stone fragments were removed.  There was still an area of mucosal abrasion laterally where the stone had been previously embedded.  I did shoot 1 final retrograde pyelogram distal to this to ensure that there was no contrast extravasation which was not appreciated.  I like to place a stent and tether given that the patient is fairly reliable and we will have him leave until Friday.  This was done by advancing a 6 x 24 French double-J ureteral stent over the wire up to the level of the kidney.  Once the wire was withdrawn a full coil was noted both within the renal pelvis and the bladder.  The bladder was drained.  The patient was then cleaned and dried, repositioned in the supine position, reversed of anesthesia, and taken to the PACU in stable condition.  Plan: He may remove his own stent on Friday.  We will have him follow-up in 4 weeks with renal ultrasound prior.  Vanna Scotland, M.D.

## 2021-02-01 NOTE — Discharge Instructions (Addendum)
You have a ureteral stent in place.  This is a tube that extends from your kidney to your bladder.  This may cause urinary bleeding, burning with urination, and urinary frequency.  Please call our office or present to the ED if you develop fevers >101 or pain which is not able to be controlled with oral pain medications.  You may be given either Flomax and/ or ditropan to help with bladder spasms and stent pain in addition to pain medications.    Your stent is on a string.  On Friday morning, you may untape the stent string and pulled gently until the entire stent is removed.  If you have any difficulty with this whatsoever, please call our office and we will be glad to assist you.  [facility] [address], [address] [place], [place] [address] [phone]    AMBULATORY SURGERY  DISCHARGE INSTRUCTIONS   The drugs that you were given will stay in your system until tomorrow so for the next 24 hours you should not:  Drive an automobile Make any legal decisions Drink any alcoholic beverage   You may resume regular meals tomorrow.  Today it is better to start with liquids and gradually work up to solid foods.  You may eat anything you prefer, but it is better to start with liquids, then soup and crackers, and gradually work up to solid foods.   Please notify your doctor immediately if you have any unusual bleeding, trouble breathing, redness and pain at the surgery site, drainage, fever, or pain not relieved by medication.    Additional Instructions:        Please contact your physician with any problems or Same Day Surgery at [phone], Monday through Friday 6 am to 4 pm, or [place] at [facility] number at [phone].

## 2021-02-02 ENCOUNTER — Encounter: Payer: Self-pay | Admitting: Urology

## 2021-02-02 ENCOUNTER — Other Ambulatory Visit: Payer: Self-pay

## 2021-02-02 DIAGNOSIS — N201 Calculus of ureter: Secondary | ICD-10-CM

## 2021-02-03 ENCOUNTER — Ambulatory Visit: Payer: Self-pay | Admitting: Urology

## 2021-02-03 LAB — CULTURE, URINE COMPREHENSIVE

## 2021-02-03 NOTE — Anesthesia Postprocedure Evaluation (Signed)
Anesthesia Post Note  Patient: Gordon Chang  Procedure(s) Performed: CYSTOSCOPY/URETEROSCOPY/HOLMIUM LASER/STENT PLACEMENT (Right: Ureter)  Patient location during evaluation: PACU Anesthesia Type: General Level of consciousness: awake and alert Pain management: pain level controlled Vital Signs Assessment: post-procedure vital signs reviewed and stable Respiratory status: spontaneous breathing, nonlabored ventilation, respiratory function stable and patient connected to nasal cannula oxygen Cardiovascular status: blood pressure returned to baseline and stable Postop Assessment: no apparent nausea or vomiting Anesthetic complications: no   No notable events documented.   Last Vitals:  Vitals:   02/01/21 1805 02/01/21 1815  BP:  (!) 144/86  Pulse: 96 84  Resp: 17 14  Temp:  (!) 36.4 C  SpO2: 100% 100%    Last Pain:  Vitals:   02/01/21 1407  TempSrc: Temporal                 Yevette Edwards

## 2021-02-05 ENCOUNTER — Other Ambulatory Visit: Payer: Self-pay

## 2021-02-05 ENCOUNTER — Ambulatory Visit: Payer: BC Managed Care – PPO

## 2021-02-05 DIAGNOSIS — N201 Calculus of ureter: Secondary | ICD-10-CM

## 2021-02-05 NOTE — Progress Notes (Unsigned)
Patient presents to clinic for string stent removal. Patient did not feel comfortable removing it himself. Patient tolerated well. No complaints. Performed by Gerarda Gunther, RMA

## 2021-03-09 ENCOUNTER — Other Ambulatory Visit: Payer: Self-pay

## 2021-03-09 ENCOUNTER — Ambulatory Visit
Admission: RE | Admit: 2021-03-09 | Discharge: 2021-03-09 | Disposition: A | Discharge status: Home / Self Care | Payer: BC Managed Care – PPO | Source: Ambulatory Visit | Attending: Urology | Admitting: Urology

## 2021-03-09 DIAGNOSIS — N201 Calculus of ureter: Secondary | ICD-10-CM | POA: Diagnosis not present

## 2021-03-09 NOTE — Progress Notes (Signed)
03/10/21 1:17 PM   Gordon Chang [birth year] [id number]  Referring provider:  Lauro Regulus, MD [address] [facility] [place],  [place] [address] Chief Complaint  Patient presents with   Follow-up     HPI: Gordon Chang is a 35 y.o.male with a personal history of epididymitis, prostatitis, and nephrolithiasis, who presents today for a 4 week post-op follow-up with RUS prior.   On 01/01/2021 he was found to have greater than 50 RBCs on urinalysis and was given pain medication and tamsulosin.    A CT renal stone study 01/22/2021 was performed and noted a calcification approximately 3 mm in size suspicious for a right mid ureteral stone.  On 02/01/2021 he underwent a cystoscopy/ureteroscopy/holmium laser/ stent placement. Intraoperative findings consisted of 3 mm impacted right distal ureteral calculus with small mucosal abrasion and narrowing at the level of the stone. Stent placed on tether.   Stent was subsequently removed without difficulty.  RUS on 03/09/2021 revealed nonobstructive 3 mm left renal stone with no hydronephrosis.   He is doing well today.  No complaints, no flank pain.   PMH: Past Medical History:  Diagnosis Date   Asthma    Kidney stone     Surgical History: Past Surgical History:  Procedure Laterality Date   CYSTOSCOPY/URETEROSCOPY/HOLMIUM LASER/STENT PLACEMENT Right 02/01/2021   Procedure: CYSTOSCOPY/URETEROSCOPY/HOLMIUM LASER/STENT PLACEMENT;  Surgeon: Vanna Scotland, MD;  Location: [facility] ORS;  Service: Urology;  Laterality: Right;   HERNIA REPAIR      Home Medications:  Allergies as of 03/10/2021   No Known Allergies      Medication List        Accurate as of March 10, 2021  1:17 PM. If you have any questions, ask your nurse or doctor.          STOP taking these medications    naproxen sodium 220 MG tablet Commonly known as: ALEVE   oxybutynin 5 MG tablet Commonly known as: DITROPAN    oxyCODONE-acetaminophen 5-325 MG tablet Commonly known as: Percocet   sulfamethoxazole-trimethoprim 800-160 MG tablet Commonly known as: BACTRIM DS   tamsulosin 0.4 MG Caps capsule Commonly known as: FLOMAX        Allergies: No Known Allergies  Family History: Family History  Problem Relation Age of Onset   Healthy Mother    Healthy Father    Prostate cancer Neg Hx    Kidney cancer Neg Hx    Bladder Cancer Neg Hx     Social History:  reports that he has never smoked. He has never used smokeless tobacco. He reports current alcohol use. He reports that he does not use drugs.   Physical Exam: BP (!) 155/92   Pulse 66   Ht 5\' 7"  (1.702 m)   Wt 125 lb (56.7 kg)   BMI 19.58 kg/m   Constitutional:  Alert and oriented, No acute distress. HEENT: [place] AT, moist mucus membranes.  Trachea midline, no masses. Cardiovascular: No clubbing, cyanosis, or edema. Respiratory: Normal respiratory effort, no increased work of breathing. Skin: No rashes, bruises or suspicious lesions. Neurologic: Grossly intact, no focal deficits, moving all 4 extremities. Psychiatric: Normal mood and affect.  Laboratory Data: Lab Results  Component Value Date   CREATININE 1.14 08/25/2016    Pertinent Imaging: CLINICAL DATA:  Ureteral stone   EXAM: RENAL / URINARY TRACT ULTRASOUND COMPLETE   COMPARISON:  CT abdomen pelvis 01/21/2021   FINDINGS: Right Kidney:   Renal measurements: 9.8 x 4.6 x 4.5  cm = volume: 104.6 mL. No hydronephrosis or nephrolithiasis.   Left Kidney:   Renal measurements: 8.6 x 4.3 x 4.5 cm = volume: 88.1 mL. No hydronephrosis. There is a 3 mm nonobstructive stone in the left mid kidney.   Bladder:   Appears normal for degree of bladder distention. Bilateral ureteral jets are seen.   Other:   None.   IMPRESSION: Nonobstructive 3 mm left renal stone. No hydronephrosis. Bilateral ureteral jets are seen.     Electronically Signed   By: Caprice Renshaw M.D.    On: 03/09/2021 15:12   I have personally reviewed the images and agree with radiologist interpretation.   Assessment & Plan:    Right ureteral stone - s/p ureteroscopy without complication -Renal ultrasound without hydronephrosis or residual stone burden on the right  2. 3 mm left renal stone  Discussed various options of any observation, ureteroscopy versus ESWL for management of the stone  He prefer observation at this time but does request to return next year for an x-ray to make sure that it has not changed in size and location  He notes today that if it is enlarging or starts become symptomatic, he may consider ESWL  We discussed general stone prevention techniques including drinking plenty water with goal of producing 2.5 L urine daily, increased citric acid intake, avoidance of high oxalate containing foods, and decreased salt intake.  Information about dietary recommendations given today.   - Follow-up with x-ray to monitor   F/u 1 year with KUB   Tawni Millers as a scribe for Vanna Scotland, MD.,have documented all relevant documentation on the behalf of Vanna Scotland, MD,as directed by  Vanna Scotland, MD while in the presence of Vanna Scotland, MD.  I have reviewed the above documentation for accuracy and completeness, and I agree with the above.   Vanna Scotland, MD  I spent 30 total minutes on the day of the encounter including pre-visit review of the medical record, face-to-face time with the patient, and post visit ordering of labs/imaging/tests.   [facility] [address], [address] [place], [place] [address] [phone]

## 2021-03-10 ENCOUNTER — Ambulatory Visit (INDEPENDENT_AMBULATORY_CARE_PROVIDER_SITE_OTHER): Payer: BC Managed Care – PPO | Admitting: Urology

## 2021-03-10 VITALS — BP 155/92 | HR 66 | Ht 67.0 in | Wt 125.0 lb

## 2021-03-10 DIAGNOSIS — N200 Calculus of kidney: Secondary | ICD-10-CM | POA: Diagnosis not present

## 2021-03-10 DIAGNOSIS — N201 Calculus of ureter: Secondary | ICD-10-CM | POA: Diagnosis not present

## 2022-03-16 ENCOUNTER — Ambulatory Visit: Payer: BC Managed Care – PPO | Admitting: Urology

## 2022-03-24 ENCOUNTER — Other Ambulatory Visit: Payer: Self-pay

## 2022-03-24 DIAGNOSIS — N200 Calculus of kidney: Secondary | ICD-10-CM

## 2022-03-29 ENCOUNTER — Ambulatory Visit: Payer: BC Managed Care – PPO | Admitting: Urology

## 2022-04-12 ENCOUNTER — Encounter: Payer: Self-pay | Admitting: Urology

## 2022-04-12 ENCOUNTER — Ambulatory Visit
Admission: RE | Admit: 2022-04-12 | Discharge: 2022-04-12 | Disposition: A | Discharge status: Home / Self Care | Payer: BC Managed Care – PPO | Source: Ambulatory Visit | Attending: Urology | Admitting: Urology

## 2022-04-12 ENCOUNTER — Ambulatory Visit (INDEPENDENT_AMBULATORY_CARE_PROVIDER_SITE_OTHER): Payer: BC Managed Care – PPO | Admitting: Urology

## 2022-04-12 ENCOUNTER — Ambulatory Visit
Admission: RE | Admit: 2022-04-12 | Discharge: 2022-04-12 | Disposition: A | Discharge status: Home / Self Care | Payer: BC Managed Care – PPO | Attending: Urology | Admitting: Urology

## 2022-04-12 VITALS — BP 157/94 | HR 73 | Ht 67.0 in | Wt 126.0 lb

## 2022-04-12 DIAGNOSIS — N200 Calculus of kidney: Secondary | ICD-10-CM | POA: Diagnosis not present

## 2022-04-12 NOTE — Progress Notes (Signed)
04/12/2022 6:23 PM   Gordon Chang 09/22/1985 [id number]  Referring provider: Lauro Regulus, MD [address] [facility] [place],  [place] [address]  Chief Complaint  Patient presents with   Nephrolithiasis    HPI: 36 year old male who presents today for routine annual follow-up with KUB  He has a personal history of recurrent nephrolithiasis occasionally requiring surgical intervention.  He also has a history of chronic testicular pain.  He reports today over the past year, he has been doing extremely well.  He has not had any acute stone events or flank pain.  He is also had no testicular pain or discomfort.  KUB today shows a stable left 3 mm stone.  There is a calcification on the right, query whether this may be known gallstones given very superior location.    He reports today that he has been doing a lot better about drinking water with lemon.  PMH: Past Medical History:  Diagnosis Date   Asthma    Kidney stone     Surgical History: Past Surgical History:  Procedure Laterality Date   CYSTOSCOPY/URETEROSCOPY/HOLMIUM LASER/STENT PLACEMENT Right 02/01/2021   Procedure: CYSTOSCOPY/URETEROSCOPY/HOLMIUM LASER/STENT PLACEMENT;  Surgeon: Vanna Scotland, MD;  Location: [facility] ORS;  Service: Urology;  Laterality: Right;   HERNIA REPAIR      Home Medications:  Allergies as of 04/12/2022   No Known Allergies      Medication List        Accurate as of April 12, 2022  6:23 PM. If you have any questions, ask your nurse or doctor.          loratadine 10 MG tablet Commonly known as: CLARITIN Take by mouth.        Allergies: No Known Allergies  Family History: Family History  Problem Relation Age of Onset   Healthy Mother    Healthy Father    Prostate cancer Neg Hx    Kidney cancer Neg Hx    Bladder Cancer Neg Hx     Social History:  reports that he has never smoked. He has never used smokeless tobacco. He reports current  alcohol use. He reports that he does not use drugs.   Physical Exam: BP (!) 157/94   Pulse 73   Ht 5\' 7"  (1.702 m)   Wt 126 lb (57.2 kg)   BMI 19.73 kg/m   Constitutional:  Alert and oriented, No acute distress. HEENT: [place] AT, moist mucus membranes.  Trachea midline, no masses. Cardiovascular: No clubbing, cyanosis, or edema. Respiratory: Normal respiratory effort, no increased work of breathing. GI: Abdomen is soft, nontender, nondistended, no abdominal masses GU: No CVA tenderness Skin: No rashes, bruises or suspicious lesions. Neurologic: Grossly intact, no focal deficits, moving all 4 extremities. Psychiatric: Normal mood and affect.  Laboratory Data: Lab Results  Component Value Date   WBC 10.5 08/25/2016   HGB 15.9 08/25/2016   HCT 46.3 08/25/2016   MCV 85.6 08/25/2016   PLT 217 08/25/2016    Lab Results  Component Value Date   CREATININE 1.14 08/25/2016    Pertinent Imaging: KUB today was personally reviewed, agree with radiologic dictation.  Is also compared to previous.  Assessment & Plan:    1. Kidney stones Stable 3 mm left stone  Completely asymptomatic  Would recommend continued observation, encouraged hydration and other stone prevention recommendations.   Return in about 2 years (around 04/12/2024) for place on recall list for 2 year follow up with KUB and [facility].  Or sooner as needed  Hollice Espy, MD  [place] [address], [place], [place] [address] [phone]

## 2022-07-10 IMAGING — US US RENAL
1 series · 14 of 25 positions shown · non-contrast
Comparison: CT abdomen pelvis 01/21/2021

CLINICAL DATA: Ureteral stone

EXAM:
RENAL / URINARY TRACT ULTRASOUND COMPLETE

[Series 1: us renal · 0.22mm/px · 14 of 36 slices shown]
[im 1/36]
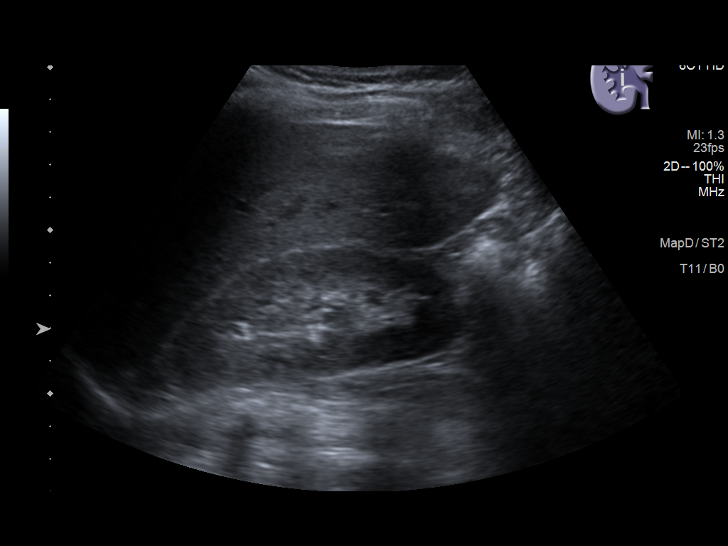
[im 3/36]
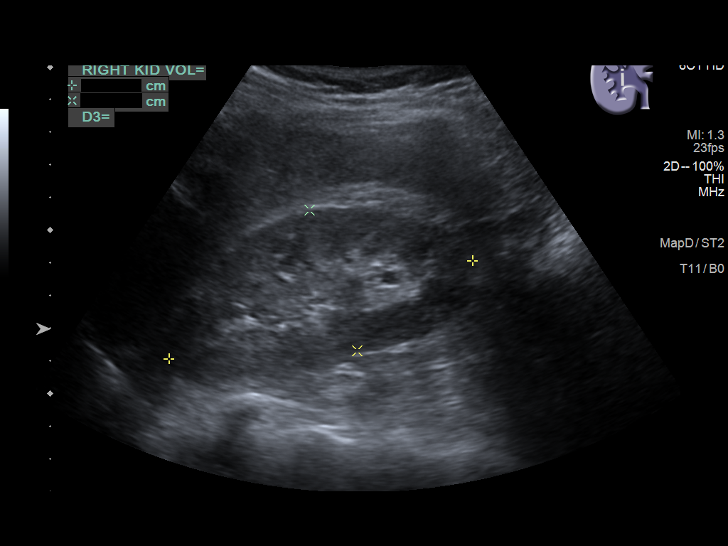
[im 6/36]
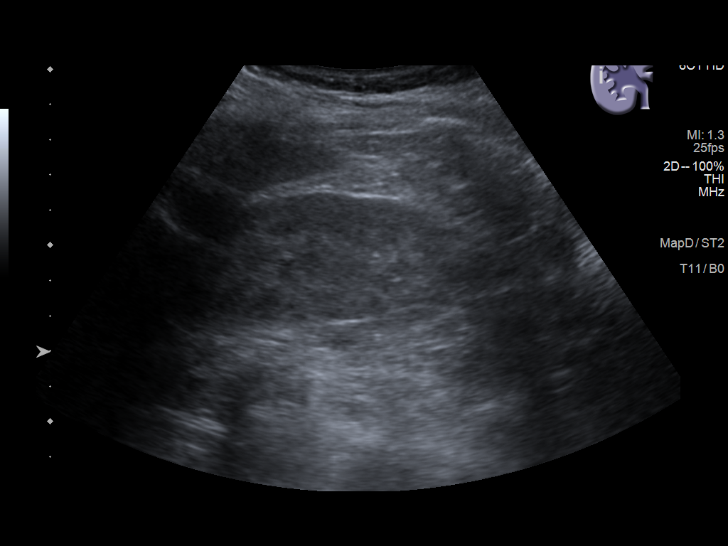
[im 9/36]
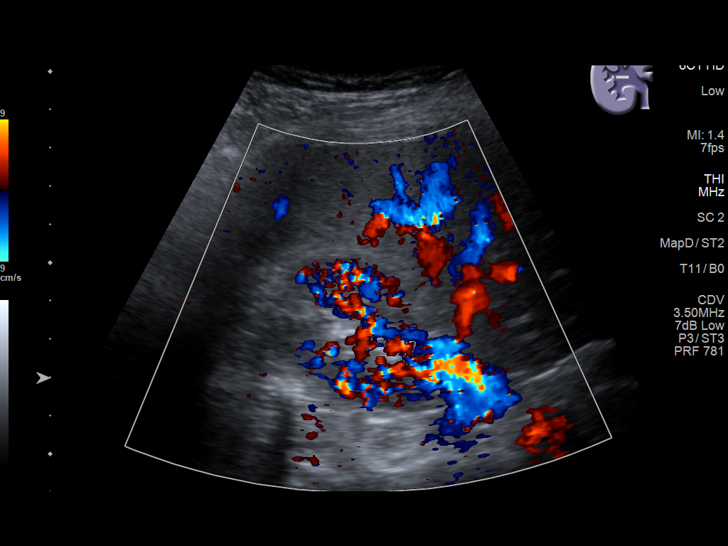
[im 12/36]
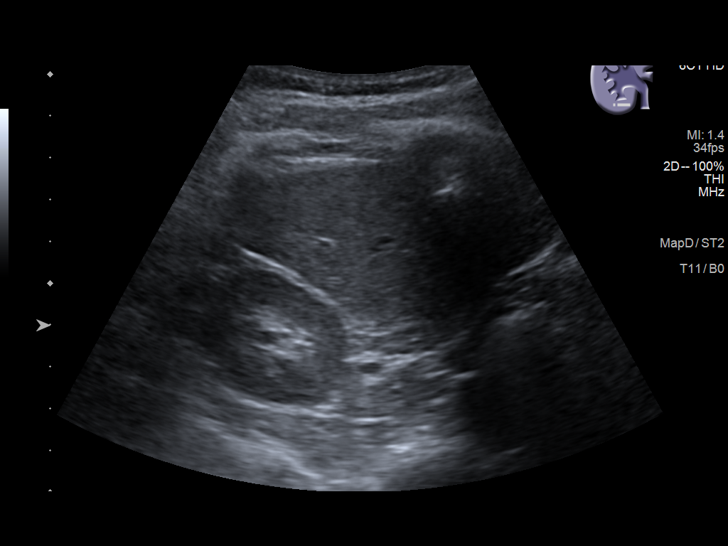
[im 14/36]
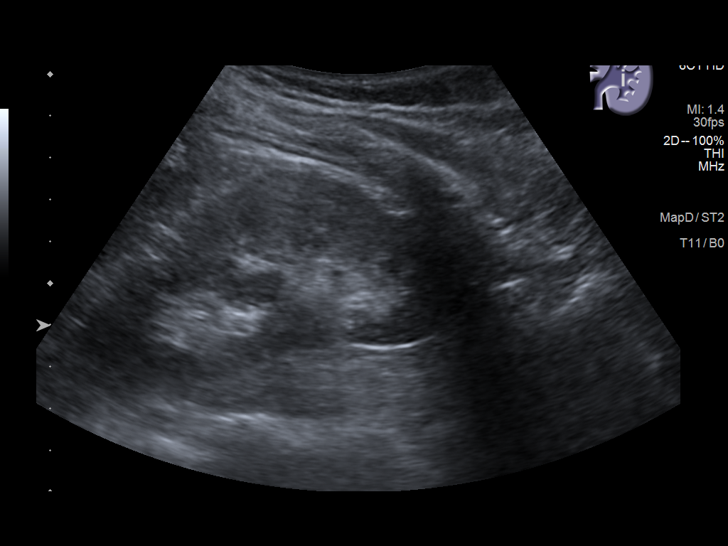
[im 17/36]
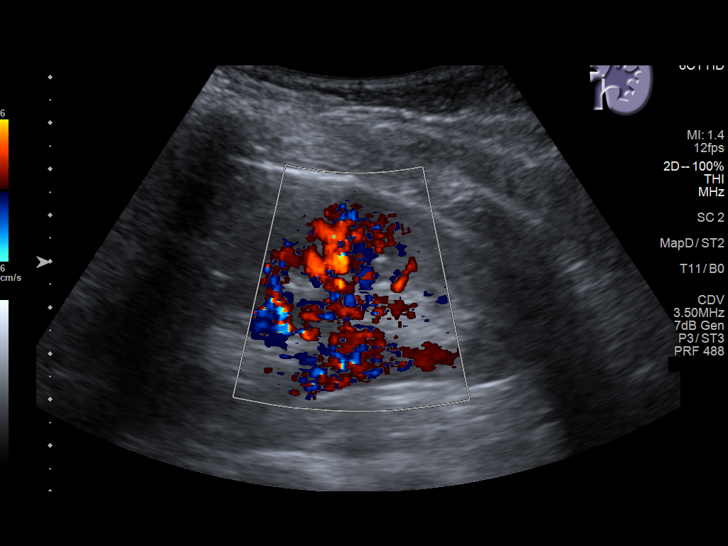
[im 19/36]
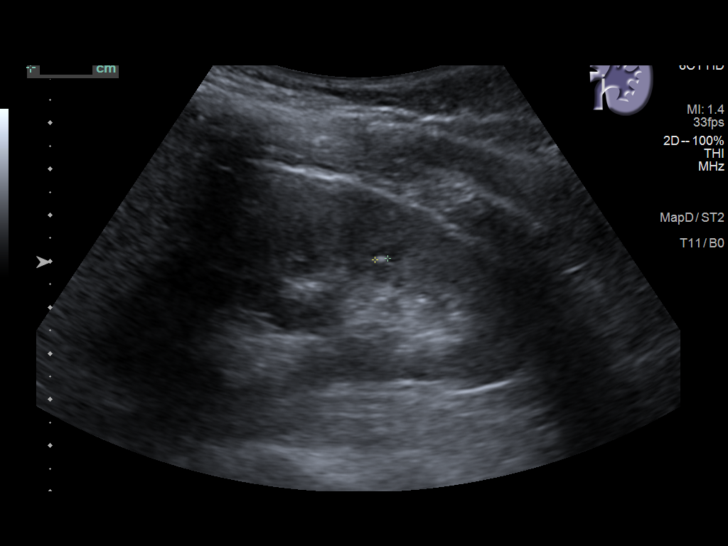
[im 22/36]
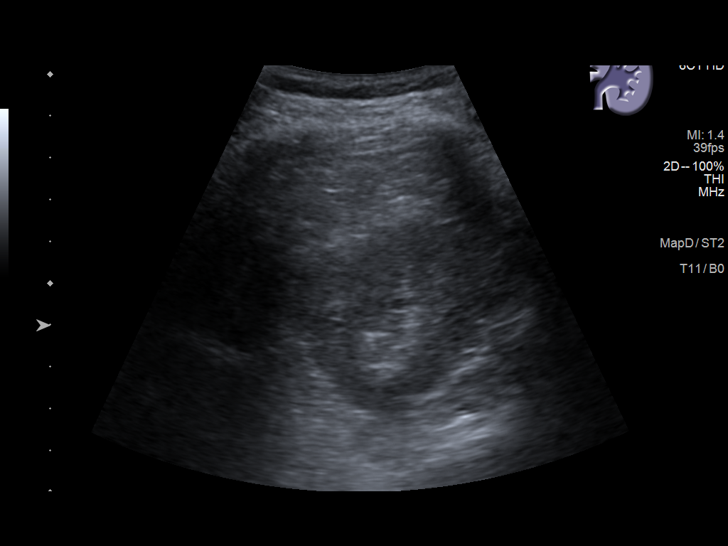
[im 24/36]
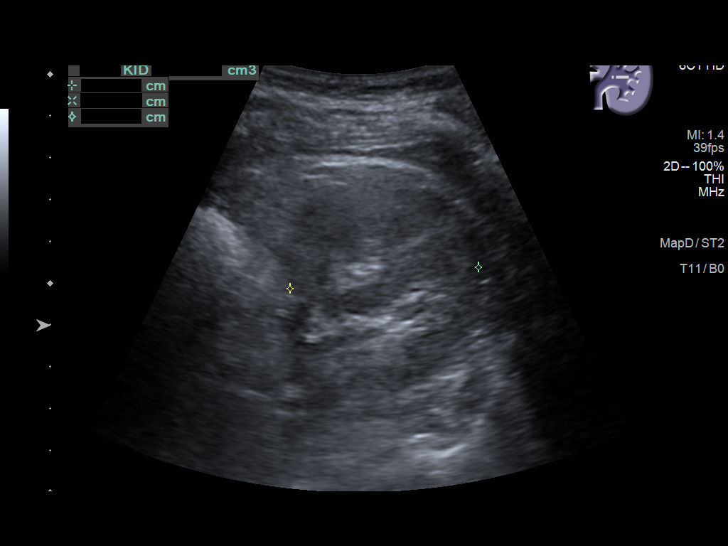
[im 27/36]
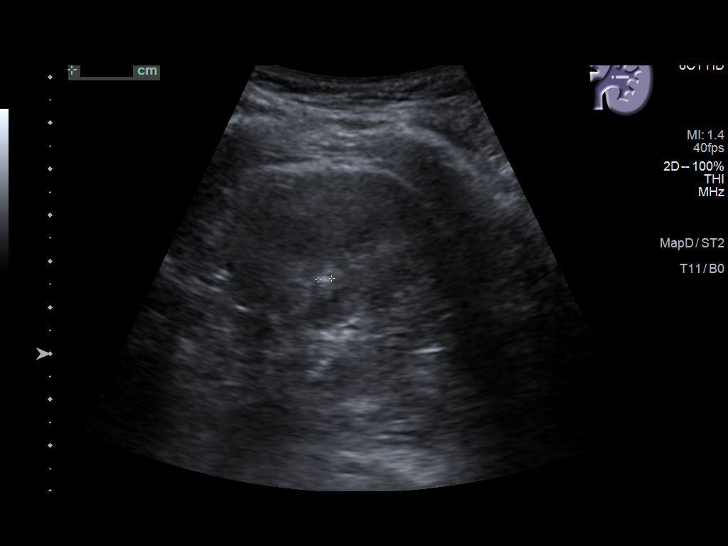
[im 30/36]
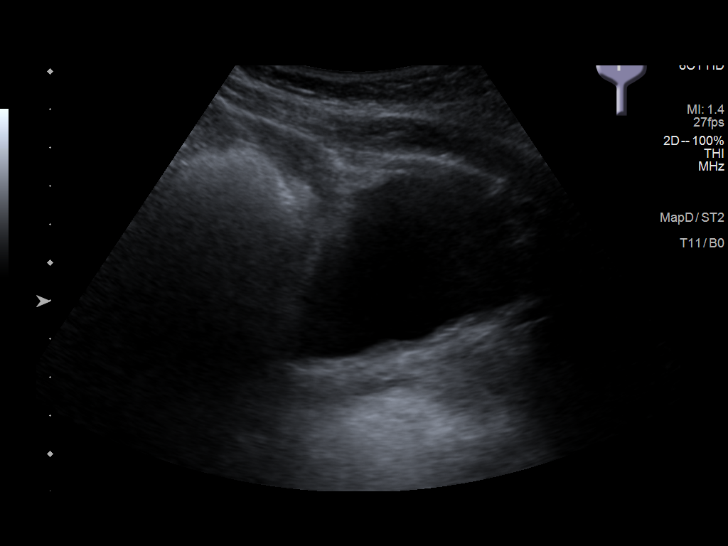
[im 33/36]
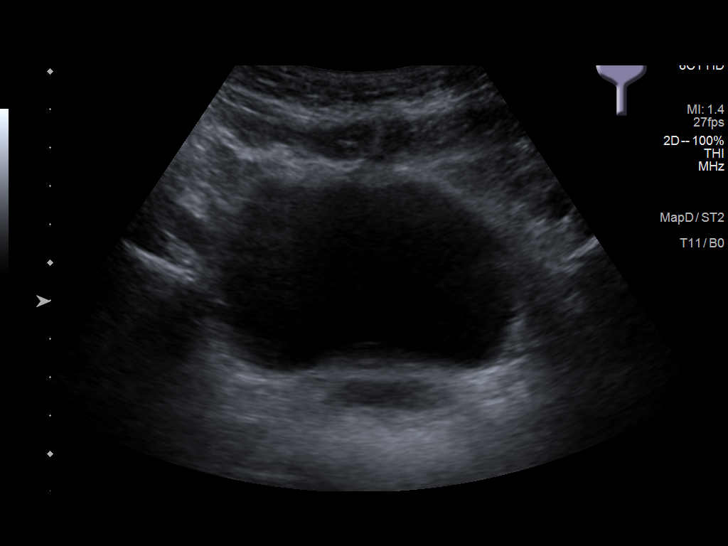
[im 36/36]
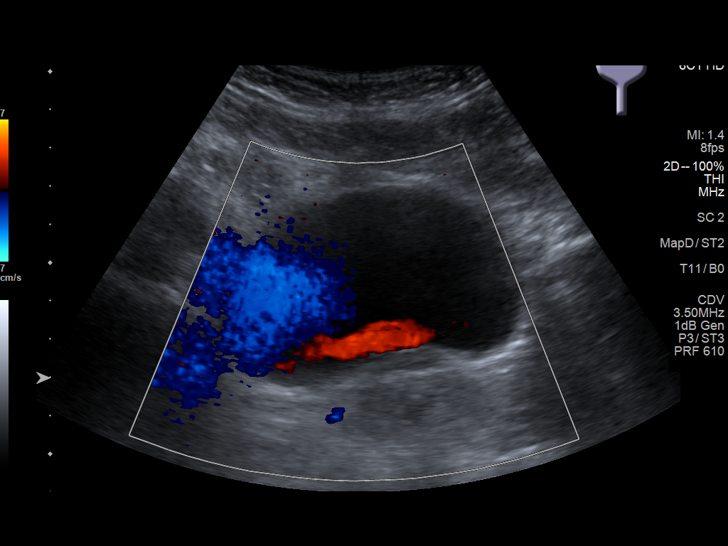

[14 of 25 positions shown; findings below may reference images not displayed]

FINDINGS: Right Kidney:

Renal measurements: 9.8 x 4.6 x 4.5 cm = volume: 104.6 mL. No
hydronephrosis or nephrolithiasis.

Left Kidney:

Renal measurements: 8.6 x 4.3 x 4.5 cm = volume: 88.1 mL. No
hydronephrosis. There is a 3 mm nonobstructive stone in the left mid
kidney.

Bladder:

Appears normal for degree of bladder distention. Bilateral ureteral
jets are seen.

Other:

None.
IMPRESSION: Nonobstructive 3 mm left renal stone. No hydronephrosis. Bilateral
ureteral jets are seen.
